# Patient Record
Sex: Male | Born: 1975 | Race: White | Hispanic: No | Smoking: Never smoker
Health system: Southern US, Community
[De-identification: ages and names within clinical notes are randomized; demographics above are authoritative.]

## PROBLEM LIST (undated history)

## (undated) DIAGNOSIS — F419 Anxiety disorder, unspecified: Secondary | ICD-10-CM

## (undated) DIAGNOSIS — K219 Gastro-esophageal reflux disease without esophagitis: Secondary | ICD-10-CM

## (undated) DIAGNOSIS — L723 Sebaceous cyst: Secondary | ICD-10-CM

## (undated) DIAGNOSIS — N453 Epididymo-orchitis: Secondary | ICD-10-CM

## (undated) DIAGNOSIS — J069 Acute upper respiratory infection, unspecified: Secondary | ICD-10-CM

## (undated) DIAGNOSIS — L0203 Carbuncle of face: Secondary | ICD-10-CM

## (undated) DIAGNOSIS — R0789 Other chest pain: Secondary | ICD-10-CM

## (undated) DIAGNOSIS — L0202 Furuncle of face: Secondary | ICD-10-CM

## (undated) DIAGNOSIS — G8929 Other chronic pain: Secondary | ICD-10-CM

## (undated) DIAGNOSIS — E538 Deficiency of other specified B group vitamins: Secondary | ICD-10-CM

## (undated) DIAGNOSIS — J329 Chronic sinusitis, unspecified: Secondary | ICD-10-CM

## (undated) DIAGNOSIS — E291 Testicular hypofunction: Secondary | ICD-10-CM

## (undated) DIAGNOSIS — E785 Hyperlipidemia, unspecified: Secondary | ICD-10-CM

## (undated) DIAGNOSIS — R0601 Orthopnea: Secondary | ICD-10-CM

## (undated) DIAGNOSIS — M549 Dorsalgia, unspecified: Secondary | ICD-10-CM

## (undated) HISTORY — DX: Testicular hypofunction: E29.1

## (undated) HISTORY — DX: Anxiety disorder, unspecified: F41.9

## (undated) HISTORY — DX: Deficiency of other specified B group vitamins: E53.8

## (undated) HISTORY — DX: Furuncle of face: L02.02

## (undated) HISTORY — DX: Carbuncle of face: L02.03

## (undated) HISTORY — DX: Other chronic pain: G89.29

## (undated) HISTORY — DX: Acute upper respiratory infection, unspecified: J06.9

## (undated) HISTORY — DX: Other chest pain: R07.89

## (undated) HISTORY — DX: Hyperlipidemia, unspecified: E78.5

## (undated) HISTORY — DX: Sebaceous cyst: L72.3

## (undated) HISTORY — DX: Orthopnea: R06.01

## (undated) HISTORY — DX: Chronic sinusitis, unspecified: J32.9

## (undated) HISTORY — PX: TONSILLECTOMY: SUR1361

## (undated) HISTORY — DX: Epididymo-orchitis: N45.3

## (undated) HISTORY — DX: Dorsalgia, unspecified: M54.9

## (undated) HISTORY — DX: Gastro-esophageal reflux disease without esophagitis: K21.9

---

## 1997-11-25 ENCOUNTER — Emergency Department (HOSPITAL_COMMUNITY): Admission: EM | Admit: 1997-11-25 | Discharge: 1997-11-25 | Payer: Self-pay | Admitting: Internal Medicine

## 1997-11-25 ENCOUNTER — Encounter: Payer: Self-pay | Admitting: Internal Medicine

## 1999-01-08 ENCOUNTER — Encounter: Payer: Self-pay | Admitting: Family Medicine

## 1999-01-08 ENCOUNTER — Encounter: Admission: RE | Admit: 1999-01-08 | Discharge: 1999-01-08 | Payer: Self-pay | Admitting: Family Medicine

## 1999-11-04 ENCOUNTER — Ambulatory Visit (HOSPITAL_COMMUNITY): Admission: RE | Admit: 1999-11-04 | Discharge: 1999-11-04 | Payer: Self-pay | Admitting: Orthopedic Surgery

## 1999-11-04 ENCOUNTER — Encounter: Payer: Self-pay | Admitting: Orthopedic Surgery

## 1999-11-18 ENCOUNTER — Ambulatory Visit (HOSPITAL_COMMUNITY): Admission: RE | Admit: 1999-11-18 | Discharge: 1999-11-18 | Payer: Self-pay | Admitting: Orthopedic Surgery

## 1999-11-18 ENCOUNTER — Encounter: Payer: Self-pay | Admitting: Orthopedic Surgery

## 1999-12-02 ENCOUNTER — Encounter: Payer: Self-pay | Admitting: Orthopedic Surgery

## 1999-12-02 ENCOUNTER — Ambulatory Visit (HOSPITAL_COMMUNITY): Admission: RE | Admit: 1999-12-02 | Discharge: 1999-12-02 | Payer: Self-pay | Admitting: Orthopedic Surgery

## 2000-11-16 ENCOUNTER — Emergency Department (HOSPITAL_COMMUNITY): Admission: EM | Admit: 2000-11-16 | Discharge: 2000-11-16 | Payer: Self-pay | Admitting: Emergency Medicine

## 2001-11-16 ENCOUNTER — Emergency Department (HOSPITAL_COMMUNITY): Admission: EM | Admit: 2001-11-16 | Discharge: 2001-11-16 | Payer: Self-pay | Admitting: Emergency Medicine

## 2001-11-16 ENCOUNTER — Encounter: Payer: Self-pay | Admitting: Emergency Medicine

## 2003-12-23 ENCOUNTER — Ambulatory Visit: Payer: Self-pay | Admitting: Family Medicine

## 2004-03-24 ENCOUNTER — Ambulatory Visit: Payer: Self-pay | Admitting: Family Medicine

## 2004-05-24 ENCOUNTER — Ambulatory Visit (HOSPITAL_BASED_OUTPATIENT_CLINIC_OR_DEPARTMENT_OTHER): Admission: RE | Admit: 2004-05-24 | Discharge: 2004-05-24 | Payer: Self-pay | Admitting: Orthopedic Surgery

## 2004-06-02 ENCOUNTER — Ambulatory Visit: Payer: Self-pay | Admitting: Family Medicine

## 2004-09-16 ENCOUNTER — Ambulatory Visit: Payer: Self-pay | Admitting: Family Medicine

## 2005-01-12 ENCOUNTER — Ambulatory Visit: Payer: Self-pay | Admitting: Pain Medicine

## 2005-01-29 ENCOUNTER — Ambulatory Visit: Payer: Self-pay | Admitting: Family Medicine

## 2006-02-01 ENCOUNTER — Ambulatory Visit: Payer: Self-pay | Admitting: Pain Medicine

## 2006-03-22 ENCOUNTER — Ambulatory Visit: Payer: Self-pay | Admitting: Family Medicine

## 2006-05-11 ENCOUNTER — Ambulatory Visit: Payer: Self-pay | Admitting: Family Medicine

## 2006-05-24 ENCOUNTER — Ambulatory Visit: Payer: Self-pay | Admitting: Family Medicine

## 2006-09-18 ENCOUNTER — Telehealth: Payer: Self-pay | Admitting: Family Medicine

## 2006-10-06 ENCOUNTER — Telehealth: Payer: Self-pay | Admitting: Family Medicine

## 2006-11-03 ENCOUNTER — Telehealth: Payer: Self-pay | Admitting: Family Medicine

## 2006-11-14 ENCOUNTER — Telehealth (INDEPENDENT_AMBULATORY_CARE_PROVIDER_SITE_OTHER): Payer: Self-pay | Admitting: *Deleted

## 2007-03-07 ENCOUNTER — Ambulatory Visit: Payer: Self-pay | Admitting: Pain Medicine

## 2007-03-12 ENCOUNTER — Ambulatory Visit: Payer: Self-pay | Admitting: Family Medicine

## 2007-03-12 DIAGNOSIS — J069 Acute upper respiratory infection, unspecified: Secondary | ICD-10-CM | POA: Insufficient documentation

## 2007-03-12 HISTORY — DX: Acute upper respiratory infection, unspecified: J06.9

## 2007-06-11 ENCOUNTER — Telehealth: Payer: Self-pay | Admitting: Family Medicine

## 2007-06-11 ENCOUNTER — Telehealth: Payer: Self-pay | Admitting: *Deleted

## 2007-06-28 DIAGNOSIS — N453 Epididymo-orchitis: Secondary | ICD-10-CM | POA: Insufficient documentation

## 2007-06-28 HISTORY — DX: Epididymo-orchitis: N45.3

## 2007-07-02 ENCOUNTER — Ambulatory Visit: Payer: Self-pay | Admitting: Family Medicine

## 2007-07-02 DIAGNOSIS — J309 Allergic rhinitis, unspecified: Secondary | ICD-10-CM | POA: Insufficient documentation

## 2007-07-02 LAB — CONVERTED CEMR LAB
Blood in Urine, dipstick: NEGATIVE
Ketones, urine, test strip: NEGATIVE
Nitrite: NEGATIVE
Urobilinogen, UA: 0.2
WBC Urine, dipstick: NEGATIVE

## 2007-07-03 ENCOUNTER — Telehealth: Payer: Self-pay | Admitting: Family Medicine

## 2007-07-09 ENCOUNTER — Telehealth: Payer: Self-pay | Admitting: *Deleted

## 2007-08-13 ENCOUNTER — Telehealth: Payer: Self-pay | Admitting: Internal Medicine

## 2007-08-14 ENCOUNTER — Encounter: Payer: Self-pay | Admitting: *Deleted

## 2007-08-20 ENCOUNTER — Ambulatory Visit: Payer: Self-pay | Admitting: Internal Medicine

## 2007-08-20 DIAGNOSIS — M549 Dorsalgia, unspecified: Secondary | ICD-10-CM

## 2007-08-20 HISTORY — DX: Dorsalgia, unspecified: M54.9

## 2007-08-23 ENCOUNTER — Encounter: Payer: Self-pay | Admitting: Internal Medicine

## 2007-08-23 ENCOUNTER — Telehealth: Payer: Self-pay | Admitting: Internal Medicine

## 2007-10-09 ENCOUNTER — Telehealth: Payer: Self-pay | Admitting: Family Medicine

## 2007-10-22 ENCOUNTER — Ambulatory Visit: Payer: Self-pay | Admitting: Family Medicine

## 2007-10-22 DIAGNOSIS — L0202 Furuncle of face: Secondary | ICD-10-CM | POA: Insufficient documentation

## 2007-10-22 DIAGNOSIS — L0203 Carbuncle of face: Secondary | ICD-10-CM

## 2007-10-22 HISTORY — DX: Furuncle of face: L02.02

## 2007-11-05 ENCOUNTER — Ambulatory Visit: Payer: Self-pay | Admitting: Family Medicine

## 2007-11-05 DIAGNOSIS — L723 Sebaceous cyst: Secondary | ICD-10-CM

## 2007-11-05 HISTORY — DX: Sebaceous cyst: L72.3

## 2008-01-21 ENCOUNTER — Ambulatory Visit: Payer: Self-pay | Admitting: Family Medicine

## 2008-01-21 DIAGNOSIS — F988 Other specified behavioral and emotional disorders with onset usually occurring in childhood and adolescence: Secondary | ICD-10-CM | POA: Insufficient documentation

## 2008-02-04 ENCOUNTER — Ambulatory Visit: Payer: Self-pay | Admitting: Family Medicine

## 2008-03-14 ENCOUNTER — Telehealth: Payer: Self-pay | Admitting: Family Medicine

## 2008-04-11 ENCOUNTER — Telehealth: Payer: Self-pay | Admitting: Family Medicine

## 2008-04-11 ENCOUNTER — Ambulatory Visit: Payer: Self-pay | Admitting: Family Medicine

## 2008-05-15 ENCOUNTER — Telehealth: Payer: Self-pay | Admitting: Family Medicine

## 2008-05-30 ENCOUNTER — Telehealth: Payer: Self-pay | Admitting: *Deleted

## 2008-05-30 ENCOUNTER — Telehealth: Payer: Self-pay | Admitting: Family Medicine

## 2008-07-02 ENCOUNTER — Ambulatory Visit: Payer: Self-pay | Admitting: Family Medicine

## 2008-07-02 DIAGNOSIS — Z862 Personal history of diseases of the blood and blood-forming organs and certain disorders involving the immune mechanism: Secondary | ICD-10-CM | POA: Insufficient documentation

## 2008-07-02 LAB — CONVERTED CEMR LAB
Alkaline Phosphatase: 58 units/L (ref 39–117)
BUN: 12 mg/dL (ref 6–23)
Basophils Absolute: 0 10*3/uL (ref 0.0–0.1)
Bilirubin, Direct: 0.1 mg/dL (ref 0.0–0.3)
CO2: 30 meq/L (ref 19–32)
Chloride: 108 meq/L (ref 96–112)
Eosinophils Absolute: 0.1 10*3/uL (ref 0.0–0.7)
Eosinophils Relative: 1.1 % (ref 0.0–5.0)
Glucose, Bld: 116 mg/dL — ABNORMAL HIGH (ref 70–99)
HCT: 46.6 % (ref 39.0–52.0)
Lymphs Abs: 1.4 10*3/uL (ref 0.7–4.0)
MCHC: 34.5 g/dL (ref 30.0–36.0)
MCV: 85.7 fL (ref 78.0–100.0)
Monocytes Absolute: 0.4 10*3/uL (ref 0.1–1.0)
Platelets: 216 10*3/uL (ref 150.0–400.0)
Potassium: 3.5 meq/L (ref 3.5–5.1)
RDW: 11.7 % (ref 11.5–14.6)
Saturation Ratios: 47.9 % (ref 20.0–50.0)
TSH: 0.82 microintl units/mL (ref 0.35–5.50)
Total Bilirubin: 0.5 mg/dL (ref 0.3–1.2)
Transferrin: 245.8 mg/dL (ref 212.0–360.0)

## 2008-07-03 ENCOUNTER — Encounter: Payer: Self-pay | Admitting: Family Medicine

## 2008-07-10 ENCOUNTER — Telehealth: Payer: Self-pay | Admitting: Family Medicine

## 2008-09-12 ENCOUNTER — Telehealth: Payer: Self-pay | Admitting: *Deleted

## 2008-09-13 ENCOUNTER — Telehealth: Payer: Self-pay | Admitting: Family Medicine

## 2008-10-16 ENCOUNTER — Telehealth: Payer: Self-pay | Admitting: Family Medicine

## 2008-10-27 ENCOUNTER — Telehealth: Payer: Self-pay | Admitting: Family Medicine

## 2008-10-30 ENCOUNTER — Ambulatory Visit: Payer: Self-pay | Admitting: Family Medicine

## 2008-12-22 ENCOUNTER — Telehealth: Payer: Self-pay | Admitting: Family Medicine

## 2009-02-05 ENCOUNTER — Ambulatory Visit: Payer: Self-pay | Admitting: Family Medicine

## 2009-02-23 ENCOUNTER — Telehealth: Payer: Self-pay | Admitting: Family Medicine

## 2009-02-24 ENCOUNTER — Telehealth: Payer: Self-pay | Admitting: Family Medicine

## 2009-02-25 ENCOUNTER — Telehealth: Payer: Self-pay | Admitting: Speech Pathology

## 2009-04-06 ENCOUNTER — Telehealth: Payer: Self-pay | Admitting: *Deleted

## 2009-07-03 ENCOUNTER — Telehealth: Payer: Self-pay | Admitting: *Deleted

## 2009-07-29 ENCOUNTER — Telehealth: Payer: Self-pay | Admitting: Family Medicine

## 2009-10-21 ENCOUNTER — Ambulatory Visit: Payer: Self-pay | Admitting: Pain Medicine

## 2009-11-16 ENCOUNTER — Telehealth: Payer: Self-pay | Admitting: Family Medicine

## 2009-12-21 ENCOUNTER — Telehealth: Payer: Self-pay | Admitting: Family Medicine

## 2010-02-14 ENCOUNTER — Encounter: Payer: Self-pay | Admitting: Orthopaedic Surgery

## 2010-02-23 NOTE — Progress Notes (Signed)
Summary: valium refill  Phone Note From Pharmacy   Summary of Call: patient is requesting a refill of valium. is this okay to fill? Initial call taken by: Kern Reap CMA (AAMA),  February 24, 2009 1:50 PM  Follow-up for Phone Call        Valium 2 mg, dispense 60 tablets, directions one nightly p.r.n. refills x 2 Follow-up by: Roderick Pee MD,  February 24, 2009 1:54 PM    New/Updated Medications: VALIUM 2 MG TABS (DIAZEPAM) take one tab at bedtime Prescriptions: VALIUM 2 MG TABS (DIAZEPAM) take one tab at bedtime  #60 x 2   Entered by:   Kern Reap CMA (AAMA)   Authorized by:   Roderick Pee MD   Signed by:   Kern Reap CMA (AAMA) on 02/24/2009   Method used:   Telephoned to ...       [facility] [address] [facility] (retail)       [address]       [place], [place]  [address]       Ph: [phone]       Fax: [phone]   RxID:   [id number]

## 2010-02-23 NOTE — Progress Notes (Signed)
Summary: requests increase ultram to 4 daily if need  Phone Note Call from Patient Call back at Work Phone [phone]   Caller: vm Call For: Cosandra Plouffe Reason for Call: Talk to Nurse Summary of Call: Re Ultram for low back & kneee pain.  Requests increasing to 3 or 4 day.  The 2 per day just not enough in the cold weather, he thinks must be causing the extra pain from his old injury.  Wants Rx for 4 daily if needed.   Other things do not work.  [facility].  [phone].   Initial call taken by: Rudy Jew, RN,  February 23, 2009 12:46 PM  Follow-up for Phone Call        if the above medication is not controlling his pain then we need a consult from orthopedics.  I would recommend Dr. Norlene Campbell is he already does not have a personal orthopedist Follow-up by: Roderick Pee MD,  February 23, 2009 1:36 PM  Additional Follow-up for Phone Call Additional follow up Details #1::        He will think it over & call back if he wants [place] to send order. Additional Follow-up by: Rudy Jew, RN,  February 23, 2009 2:02 PM     Appended Document: requests increase ultram to 4 daily if need Wants call backat [phone].  Appended Document: requests increase ultram to 4 daily if need No answer.  Appended Document: requests increase ultram to 4 daily if need No answer.  Appended Document: requests increase ultram to 4 daily if need Has been through ortho & pain management before & doesn't want to do again.  Requests that Dr. Tawanna Cooler call him.

## 2010-02-23 NOTE — Progress Notes (Signed)
Summary: adderall refill  Phone Note Refill Request   Refills Requested: Medication #1:  ADDERALL XR 20 MG XR24H-CAP 2 by mouth qam  Medication #2:  ADDERALL 20 MG TABS 1 @ 3 pm daily Initial call taken by: Kern Reap CMA Duncan Dull),  November 16, 2009 8:33 AM  Follow-up for Phone Call        patient only wants one month rx until he finds out if the pharamcy carries this rx or what his insurance will cover. Follow-up by: Kern Reap CMA Duncan Dull),  November 16, 2009 8:34 AM    Prescriptions: ADDERALL 20 MG TABS (AMPHETAMINE-DEXTROAMPHETAMINE) 1 @ 3 pm daily  #30 x 0   Entered by:   Kern Reap CMA (AAMA)   Authorized by:   Roderick Pee MD   Signed by:   Kern Reap CMA (AAMA) on 11/16/2009   Method used:   Print then Give to Patient   RxID:   [id number] ADDERALL XR 20 MG XR24H-CAP (AMPHETAMINE-DEXTROAMPHETAMINE) 2 by mouth qam  #60 x 0   Entered by:   Kern Reap CMA (AAMA)   Authorized by:   Roderick Pee MD   Signed by:   Kern Reap CMA (AAMA) on 11/16/2009   Method used:   Print then Give to Patient   RxID:   [id number]

## 2010-02-23 NOTE — Progress Notes (Signed)
Summary: referral pain management  Phone Note Call from Patient Call back at Work Phone [phone]   Call For: Roderick Pee MD Summary of Call: Will go back to Pain Management , [facility], Dr. Laban Emperor, [phone] or as he's in a lot of pain low back, knee, old injury.  He called them to get appointment, but they have to have referral.  Please do the referral & then he'll call & get appointment.  Please call him back at [phone]. Initial call taken by: Rudy Jew, RN,  February 25, 2009 12:33 PM  Follow-up for Phone Call        ok to refer  asap !!!!!!!!!!!!! Follow-up by: Roderick Pee MD,  February 26, 2009 1:48 PM

## 2010-02-23 NOTE — Progress Notes (Signed)
Summary: rx refill  Phone Note Refill Request Message from:  Patient on April 06, 2009 10:03 AM  Refills Requested: Medication #1:  ADDERALL XR 20 MG XR24H-CAP 2 by mouth qam  Medication #2:  ADDERALL 20 MG TABS 1 @ 3 pm daily Initial call taken by: Kern Reap CMA Duncan Dull),  April 06, 2009 10:03 AM    Prescriptions: ADDERALL 20 MG TABS (AMPHETAMINE-DEXTROAMPHETAMINE) take one tab at 3 pm   fill in two months  #30 x 0   Entered by:   Kern Reap CMA (AAMA)   Authorized by:   Roderick Pee MD   Signed by:   Kern Reap CMA (AAMA) on 04/06/2009   Method used:   Print then Give to Patient   RxID:   [id number] ADDERALL 20 MG TABS (AMPHETAMINE-DEXTROAMPHETAMINE) take one tab at 3 pm  fill in one month  #30 x 0   Entered by:   Kern Reap CMA (AAMA)   Authorized by:   Roderick Pee MD   Signed by:   Kern Reap CMA (AAMA) on 04/06/2009   Method used:   Print then Give to Patient   RxID:   [id number] ADDERALL 20 MG TABS (AMPHETAMINE-DEXTROAMPHETAMINE) 1 @ 3 pm daily  #30 x 0   Entered by:   Kern Reap CMA (AAMA)   Authorized by:   Roderick Pee MD   Signed by:   Kern Reap CMA (AAMA) on 04/06/2009   Method used:   Print then Give to Patient   RxID:   [id number] ADDERALL XR 20 MG XR24H-CAP (AMPHETAMINE-DEXTROAMPHETAMINE) take two tabs in the morning  fill in two months  #60 x 0   Entered by:   Kern Reap CMA (AAMA)   Authorized by:   Roderick Pee MD   Signed by:   Kern Reap CMA (AAMA) on 04/06/2009   Method used:   Print then Give to Patient   RxID:   [id number] ADDERALL XR 20 MG XR24H-CAP (AMPHETAMINE-DEXTROAMPHETAMINE) take two tabs in the morning fill in one month  #60 x 0   Entered by:   Kern Reap CMA (AAMA)   Authorized by:   Roderick Pee MD   Signed by:   Kern Reap CMA (AAMA) on 04/06/2009   Method used:   Print then Give to Patient   RxID:   [id number] ADDERALL XR 20 MG XR24H-CAP  (AMPHETAMINE-DEXTROAMPHETAMINE) 2 by mouth qam  #60 x 0   Entered by:   Kern Reap CMA (AAMA)   Authorized by:   Roderick Pee MD   Signed by:   Kern Reap CMA (AAMA) on 04/06/2009   Method used:   Print then Give to Patient   RxID:   [id number]

## 2010-02-23 NOTE — Progress Notes (Signed)
Summary: tramadol refill - fyi  Phone Note From Pharmacy   Summary of Call: patient is requesting a refill of tramadol.  he has been referred to pain management. is this okay to fill? Initial call taken by: Kern Reap CMA Duncan Dull),  July 29, 2009 11:10 AM  Follow-up for Phone Call        I thought he was already going to pain management????????????? if he hasn't been.......Marland Kitchen refill his  tramadol 50 mg p.o. t.i.d. p.r.n. dispense 60 tabs, refills x 1, if, however, he is going to the pain clinic, then they need to write all his pain medicine Follow-up by: Roderick Pee MD,  July 29, 2009 11:27 AM  Additional Follow-up for Phone Call Additional follow up Details #1::        patient has decided not to go to the pain clinic.  He is going to try and control his pain with tramadol. Additional Follow-up by: Kern Reap CMA Duncan Dull),  July 29, 2009 12:59 PM    Additional Follow-up for Phone Call Additional follow up Details #2::    please call patient ........Marland Kitchen recommend he go to the pain clinic for a complete evaluation and not take tramadol on a long-term basis.. have him call and reschedule or reschedule for him Follow-up by: Roderick Pee MD,  July 30, 2009 8:32 AM  Additional Follow-up for Phone Call Additional follow up Details #3:: Details for Additional Follow-up Action Taken: spoke with patient's wife Additional Follow-up by: Kern Reap CMA Duncan Dull),  July 30, 2009 2:15 PM

## 2010-02-23 NOTE — Progress Notes (Signed)
Summary: Rx Refill  Phone Note Refill Request Call back at Home Phone [phone] Call back at Work Phone [phone] Message from:  Patient on December 21, 2009 9:28 AM  Refills Requested: Medication #1:  ADDERALL 20 MG TABS 1 @ 3 pm daily  Medication #2:  ADDERALL XR 20 MG XR24H-CAP 2 by mouth qam Pt wants to know if med can be switced since it is hard getting adderall filled due to shortage.  Please call when rx is available for pick up.   Method Requested: Pick up at Office Initial call taken by: Trixie Dredge,  December 21, 2009 9:28 AM Caller: Patient Initial call taken by: Trixie Dredge,  December 21, 2009 9:28 AM  Follow-up for Phone Call        Pt called again. Follow-up by: Warnell Forester,  December 21, 2009 1:06 PM    Prescriptions: ADDERALL 20 MG TABS (AMPHETAMINE-DEXTROAMPHETAMINE) take one tab at 3 pm   fill in two months  #30 x 0   Entered by:   Kern Reap CMA (AAMA)   Authorized by:   Roderick Pee MD   Signed by:   Kern Reap CMA (AAMA) on 12/21/2009   Method used:   Print then Give to Patient   RxID:   [id number] ADDERALL 20 MG TABS (AMPHETAMINE-DEXTROAMPHETAMINE) take one tab at 3 pm  fill in one month  #30 x 0   Entered by:   Kern Reap CMA (AAMA)   Authorized by:   Roderick Pee MD   Signed by:   Kern Reap CMA (AAMA) on 12/21/2009   Method used:   Print then Give to Patient   RxID:   [id number] ADDERALL XR 20 MG XR24H-CAP (AMPHETAMINE-DEXTROAMPHETAMINE) take two tabs in the morning  fill in two months  #60 x 0   Entered by:   Kern Reap CMA (AAMA)   Authorized by:   Roderick Pee MD   Signed by:   Kern Reap CMA (AAMA) on 12/21/2009   Method used:   Print then Give to Patient   RxID:   [id number] ADDERALL XR 20 MG XR24H-CAP (AMPHETAMINE-DEXTROAMPHETAMINE) take two tabs in the morning fill in one month  #60 x 0   Entered by:   Kern Reap CMA (AAMA)   Authorized by:   Roderick Pee  MD   Signed by:   Kern Reap CMA (AAMA) on 12/21/2009   Method used:   Print then Give to Patient   RxID:   [id number] ADDERALL 20 MG TABS (AMPHETAMINE-DEXTROAMPHETAMINE) 1 @ 3 pm daily  #30 x 0   Entered by:   Kern Reap CMA (AAMA)   Authorized by:   Roderick Pee MD   Signed by:   Kern Reap CMA (AAMA) on 12/21/2009   Method used:   Print then Give to Patient   RxID:   [id number] ADDERALL XR 20 MG XR24H-CAP (AMPHETAMINE-DEXTROAMPHETAMINE) 2 by mouth qam  #60 x 0   Entered by:   Kern Reap CMA (AAMA)   Authorized by:   Roderick Pee MD   Signed by:   Kern Reap CMA (AAMA) on 12/21/2009   Method used:   Print then Give to Patient   RxID:   [id number]

## 2010-02-23 NOTE — Assessment & Plan Note (Signed)
Summary: R WRIST PAIN // RS   Vital Signs:  Patient profile:   35 year old male Height:      68 inches Weight:      184 pounds BMI:     28.08 Temp:     98.9 degrees F oral BP sitting:   110 / 70  (left arm)  Vitals Entered By: Doristine Devoid (February 05, 2009 12:15 PM) CC: R wrist pain and knot under L chin   CC:  R wrist pain and knot under L chin.  History of Present Illness: Nida Boatman is a 35 year old male, who comes in today for evaluation of 4 problems.  His pain in both wrists worse on the right than left.  He does a lot of martial arts.  No history of any fractures.  He had an infected cyst under the angle of his left jaw.  He expressed some pus material, but not gone.  His allergy problems, has now gotten worse.  These yusted  to be mild and seasonal and   All year round.  The only new environmental thing is that his dog is sleeping in their bedroom at night.  Now.  No history of asthma.  He would like a patch for motion sickness.  Also for   His back and his hands he would prefer some ultram  instead of the Motrin  Allergies: 1)  ! Penicillin PMH-FH-SH reviewed for relevance  Family History: Reviewed history from 07/02/2008 and no changes required. did has diabetes, reflux, esophagitis, and a borderline personality mom has  MS and mood swingsone brother in good health.  No problems  mother has hemachromatosis  Social History: Reviewed history from 03/12/2007 and no changes required. Occupation: gunsmith Married Never Smoked Alcohol use-no Drug use-no Regular exercise-yes  Review of Systems      See HPI  Physical Exam  General:  Well-developed,well-nourished,in no acute distress; alert,appropriate and cooperative throughout examination Head:  Normocephalic and atraumatic without obvious abnormalities. No apparent alopecia or balding. Eyes:  No corneal or conjunctival inflammation noted. EOMI. Perrla. Funduscopic exam benign, without hemorrhages, exudates  or papilledema. Vision grossly normal. Ears:  External ear exam shows no significant lesions or deformities.  Otoscopic examination reveals clear canals, tympanic membranes are intact bilaterally without bulging, retraction, inflammation or discharge. Hearing is grossly normal bilaterally. Nose:  External nasal examination shows no deformity or inflammation. Nasal mucosa are pink and moist without lesions or exudates. Mouth:  Oral mucosa and oropharynx without lesions or exudates.  Teeth in good repair. Neck:  No deformities, masses, or tenderness noted. Msk:  No deformity or scoliosis noted of thoracic or lumbar spine.   Skin:  there is a BB-sized lesion under the angle of the left jaw.  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 2 by mouth qam 2)  Adderall 20 Mg Tabs (Amphetamine-dextroamphetamine) .Marland Kitchen.. 1 @ 3 pm daily 3)  Adderall Xr 20 Mg Xr24h-cap (Amphetamine-dextroamphetamine) .... Take two tabs in the morning fill in one month 4)  Adderall Xr 20 Mg Xr24h-cap (Amphetamine-dextroamphetamine) .... Take two tabs in the morning  fill in two months 5)  Adderall 20 Mg Tabs (Amphetamine-dextroamphetamine) .... Take one tab at 3 pm  fill in one month 6)  Adderall 20 Mg Tabs (Amphetamine-dextroamphetamine) .... Take one tab at 3 pm   fill in two months 7)  Vicodin Es 7.5-750 Mg Tabs (Hydrocodone-acetaminophen) .Marland Kitchen.. 1 tab @ bedtime as needed back pain 8)  Hydromet 5-1.5 Mg/30ml Syrp (Hydrocodone-homatropine) .Marland Kitchen.. 1 or 2 tsps at bedtime as needed 9)  Biaxin 500 Mg Tabs (Clarithromycin) .Marland Kitchen.. 1 tab @ bedtime x 1o days 10)  Ultram 50 Mg Tabs (Tramadol hcl) .... Take 1 tablet by mouth two times a  day as needed pain 11)  Transderm-scop 1.5 Mg Pt72 (Scopolamine base) .... Uad  Patient Instructions: 1)  for the lesion under your last child take Biaxin 500 mg one tab nightly x 10 days. 2)  Used the Transderm-Scop, every 3 days for motion sickness. 3)  u  may take Ultram one or two tabs daily for wrist and back pain. 4)  Take Claritin or Zyrtec daily.  No D. and consider removing the dog from y  bedroom if you would like to pursue this further,   Call Dr. Reather Converse allergist Prescriptions: BIAXIN 500 MG TABS (CLARITHROMYCIN) 1 tab @ bedtime x 1o days  #10 x 1   Entered and Authorized by:   Roderick Pee MD   Signed by:   Roderick Pee MD on 02/05/2009   Method used:   Print then Give to Patient   RxID:   [id number] TRANSDERM-SCOP 1.5 MG PT72 (SCOPOLAMINE BASE) UAD  #4 x 0   Entered and Authorized by:   Roderick Pee MD   Signed by:   Roderick Pee MD on 02/05/2009   Method used:   Print then Give to Patient   RxID:   [id number] ULTRAM 50 MG TABS (TRAMADOL HCL) Take 1 tablet by mouth two times a day as needed pain  #60 x 5   Entered and Authorized by:   Roderick Pee MD   Signed by:   Roderick Pee MD on 02/05/2009   Method used:   Print then Give to Patient   RxID:   [id number] BIAXIN 500 MG TABS (CLARITHROMYCIN) 1 tab @ bedtime x 1o days  #10 x 1   Entered and Authorized by:   Roderick Pee MD   Signed by:   Roderick Pee MD on 02/05/2009   Method used:   Electronically to        [facility]  [address] (retail)       [address]       [place], [place]  [address]       Ph: [phone]       Fax: [phone]   RxID:   [id number]

## 2010-02-23 NOTE — Assessment & Plan Note (Signed)
Summary: severe leg pain/njr   Vital Signs:  Patient profile:   35 year old male Height:      68 inches Weight:      175 pounds BMI:     26.70 Temp:     98.9 degrees F oral BP sitting:   120 / 84  (left arm) Cuff size:   regular  Vitals Entered By: Kern Reap CMA (April 11, 2008 10:42 AM)  Reason for Visit left knee pain Pain Assessment Patient in pain? yes     Location: knee Intensity: 7 Type: sharp Onset of pain  Intermittent   History of Present Illness: Steven Murray is a 35 year old, married male, who comes in today for evaluation of pain in his left hip for 6 weeks.  He states 6 weeks ago.  He was jumping across to create the nurse gave way and all the pressure went to his left hip.  Since that time.  He had severe pain in the hip.  He describes it as constant, dull, sometimes sharp, on a scale of one to 10 and 8.  No previous history of trauma.  The pain radiates from his hip all of her down to his foot.  Allergies: 1)  ! Penicillin  Past History:  Past medical history reviewed for relevance to current acute and chronic problems. Social history (including risk factors) reviewed for relevance to current acute and chronic problems.  Past Medical History:    Reviewed history from 01/21/2008 and no changes required:    Allergic rhinitis    ADD  Social History:    Reviewed history from 03/12/2007 and no changes required:       Occupation: gunsmith       Married       Never Smoked       Alcohol use-no       Drug use-no       Regular exercise-yes  Review of Systems      See HPI  Physical Exam  General:  Well-developed,well-nourished,in no acute distress; alert,appropriate and cooperative throughout examination Msk:  examination of both lower extremities appear normal.  There is 0.25-inch leg length discrepancy, left leg, not significant.  Sensation muscle strength.  Reflexes are all within normal limits.  There is pain with external rotation of the left hip.   Right hip normal Pulses:  R and L carotid,radial,femoral,dorsalis pedis and posterior tibial pulses are full and equal bilaterally Extremities:  No clubbing, cyanosis, edema, or deformity noted with normal full range of motion of all joints.   Neurologic:  No cranial nerve deficits noted. Station and gait are normal. Plantar reflexes are down-going bilaterally. DTRs are symmetrical throughout. Sensory, motor and coordinative functions appear intact.   Impression & Recommendations:  Problem # 1:  HIP PAIN, LEFT (ICD-719.45) Assessment New  Orders: Prescription Created Electronically [phone]) T-Bilateral Hip w/Pelvis, min 2 views (73520TC)  His updated medication list for this problem includes:    Vicodin 5-500 Mg Tabs (Hydrocodone-acetaminophen) .Marland Kitchen... Take 1 tablet by mouth four times a day as needed pain  Complete Medication List: 1)  Adderall Xr 20 Mg Xr24h-cap (Amphetamine-dextroamphetamine) .... 2 by mouth qam 2)  Adderall 20 Mg Tabs (Amphetamine-dextroamphetamine) .Marland Kitchen.. 1 @ 3 pm daily 3)  Doxycycline Hyclate 100 Mg Tabs (Doxycycline hyclate) .... One by mouth bid 4)  Vicodin 5-500 Mg Tabs (Hydrocodone-acetaminophen) .... Take 1 tablet by mouth four times a day as needed pain  Patient Instructions: 1)  take 600 mg of Motrin 3 times a  day with food.  Also Vicodin, one tablet every 4 to 6 hours as needed for severe pain.  I will call you I get your radiology report and will go from there Prescriptions: VICODIN 5-500 MG TABS (HYDROCODONE-ACETAMINOPHEN) Take 1 tablet by mouth four times a day as needed pain  #50 x 2   Entered and Authorized by:   Roderick Pee MD   Signed by:   Roderick Pee MD on 04/11/2008   Method used:   Print then Mail to Patient   RxID:   [id number]

## 2010-02-23 NOTE — Progress Notes (Signed)
Summary: Refill Both RX for Adderall  Phone Note Call from Patient   Caller: Spouse Call For: Roderick Pee MD Summary of Call: Refill Request for: Medication #1:  ADDERALL XR 20 MG XR24H-CAP 2 by mouth qam  and Medication #2:  ADDERALL 20 MG TABS 1 @ 3 pm daily  Call Wife back before end of day totally out of meds:: [phone] Initial call taken by: Kathrynn Speed CMA,  July 03, 2009 9:36 AM  Follow-up for Phone Call        is it time ????????? Follow-up by: Roderick Pee MD,  July 03, 2009 9:42 AM    Prescriptions: ADDERALL 20 MG TABS (AMPHETAMINE-DEXTROAMPHETAMINE) take one tab at 3 pm   fill in two months  #30 x 0   Entered by:   Kathrynn Speed CMA   Authorized by:   Roderick Pee MD   Signed by:   Kathrynn Speed CMA on 07/03/2009   Method used:   Print then Give to Patient   RxID:   [id number] ADDERALL 20 MG TABS (AMPHETAMINE-DEXTROAMPHETAMINE) take one tab at 3 pm  fill in one month  #30 x 0   Entered by:   Kathrynn Speed CMA   Authorized by:   Roderick Pee MD   Signed by:   Kathrynn Speed CMA on 07/03/2009   Method used:   Print then Give to Patient   RxID:   [id number] ADDERALL 20 MG TABS (AMPHETAMINE-DEXTROAMPHETAMINE) 1 @ 3 pm daily  #30 x 0   Entered by:   Kathrynn Speed CMA   Authorized by:   Roderick Pee MD   Signed by:   Kathrynn Speed CMA on 07/03/2009   Method used:   Print then Give to Patient   RxID:   [id number] ADDERALL XR 20 MG XR24H-CAP (AMPHETAMINE-DEXTROAMPHETAMINE) take two tabs in the morning  fill in two months  #60 x 0   Entered by:   Kathrynn Speed CMA   Authorized by:   Roderick Pee MD   Signed by:   Kathrynn Speed CMA on 07/03/2009   Method used:   Print then Give to Patient   RxID:   [id number] ADDERALL XR 20 MG XR24H-CAP (AMPHETAMINE-DEXTROAMPHETAMINE) take two tabs in the morning fill in one month  #60 x 0   Entered by:   Kathrynn Speed CMA   Authorized by:   Roderick Pee MD   Signed by:   Kathrynn Speed CMA on 07/03/2009   Method used:   Print then Give to Patient   RxID:   [id number] ADDERALL XR 20 MG XR24H-CAP (AMPHETAMINE-DEXTROAMPHETAMINE) 2 by mouth qam  #60 x 0   Entered by:   Kathrynn Speed CMA   Authorized by:   Roderick Pee MD   Signed by:   Kathrynn Speed CMA on 07/03/2009   Method used:   Print then Give to Patient   RxID:   [id number]

## 2010-04-01 ENCOUNTER — Other Ambulatory Visit: Payer: Self-pay | Admitting: Family Medicine

## 2010-04-01 NOTE — Telephone Encounter (Signed)
Pt needs adderall xr 20mg  and adderall 20mg  3 month supply.

## 2010-04-03 NOTE — Telephone Encounter (Signed)
Ok x 3 mo. 

## 2010-04-05 MED ORDER — AMPHETAMINE-DEXTROAMPHETAMINE 20 MG PO TABS: ORAL_TABLET | ORAL | Status: DC

## 2010-04-05 MED ORDER — AMPHETAMINE-DEXTROAMPHET ER 20 MG PO CP24: ORAL_CAPSULE | ORAL | Status: DC

## 2010-04-06 NOTE — Telephone Encounter (Signed)
rx ready for pick up. Left message on machine for patient. 

## 2010-06-22 ENCOUNTER — Other Ambulatory Visit: Payer: Self-pay | Admitting: Family Medicine

## 2010-06-22 NOTE — Telephone Encounter (Signed)
Time for an office visit 

## 2010-07-29 ENCOUNTER — Telehealth: Payer: Self-pay | Admitting: *Deleted

## 2010-07-29 MED ORDER — AMPHETAMINE-DEXTROAMPHET ER 20 MG PO CP24: ORAL_CAPSULE | ORAL | Status: DC

## 2010-07-29 MED ORDER — AMPHETAMINE-DEXTROAMPHETAMINE 20 MG PO TABS: ORAL_TABLET | ORAL | Status: DC

## 2010-07-29 NOTE — Telephone Encounter (Signed)
Wife is calling for refill of adderall.  Last office visit was 1/11.  Is this okay to fill?

## 2010-07-29 NOTE — Telephone Encounter (Signed)
Okay to give 30 tabs one daily office visit and to within two weeks

## 2010-07-30 NOTE — Telephone Encounter (Signed)
Pts wife called and said that pts mother in law, Janyth Pupa, will be picking script up for pt.

## 2010-07-30 NOTE — Telephone Encounter (Signed)
rx ready for pick up and patient is aware  

## 2012-10-27 ENCOUNTER — Emergency Department (HOSPITAL_COMMUNITY)
Admission: EM | Admit: 2012-10-27 | Discharge: 2012-10-27 | Disposition: A | Discharge status: Home / Self Care | Payer: BC Managed Care – PPO | Attending: Emergency Medicine | Admitting: Emergency Medicine

## 2012-10-27 ENCOUNTER — Encounter (HOSPITAL_COMMUNITY): Payer: Self-pay | Admitting: Emergency Medicine

## 2012-10-27 DIAGNOSIS — Z79899 Other long term (current) drug therapy: Secondary | ICD-10-CM | POA: Insufficient documentation

## 2012-10-27 DIAGNOSIS — S0501XA Injury of conjunctiva and corneal abrasion without foreign body, right eye, initial encounter: Secondary | ICD-10-CM

## 2012-10-27 DIAGNOSIS — S058X9A Other injuries of unspecified eye and orbit, initial encounter: Secondary | ICD-10-CM | POA: Insufficient documentation

## 2012-10-27 DIAGNOSIS — H53149 Visual discomfort, unspecified: Secondary | ICD-10-CM | POA: Insufficient documentation

## 2012-10-27 DIAGNOSIS — Y9389 Activity, other specified: Secondary | ICD-10-CM | POA: Insufficient documentation

## 2012-10-27 DIAGNOSIS — IMO0002 Reserved for concepts with insufficient information to code with codable children: Secondary | ICD-10-CM | POA: Insufficient documentation

## 2012-10-27 DIAGNOSIS — Y929 Unspecified place or not applicable: Secondary | ICD-10-CM | POA: Insufficient documentation

## 2012-10-27 DIAGNOSIS — Z880 Allergy status to penicillin: Secondary | ICD-10-CM | POA: Insufficient documentation

## 2012-10-27 MED ORDER — OXYCODONE-ACETAMINOPHEN 5-325 MG PO TABS
2.0000 | ORAL_TABLET | Freq: Once | ORAL | Status: AC
  Administered 2012-10-27: 2 via ORAL
  Filled 2012-10-27: qty 2

## 2012-10-27 MED ORDER — OXYCODONE-ACETAMINOPHEN 5-325 MG PO TABS: 1.0000 | ORAL_TABLET | ORAL | Status: DC | PRN

## 2012-10-27 MED ORDER — OXYCODONE-ACETAMINOPHEN 5-325 MG PO TABS
1.0000 | ORAL_TABLET | Freq: Once | ORAL | Status: DC
  Filled 2012-10-27: qty 1

## 2012-10-27 MED ORDER — TETRACAINE HCL 0.5 % OP SOLN
1.0000 [drp] | Freq: Once | OPHTHALMIC | Status: AC
  Administered 2012-10-27: 1 [drp] via OPHTHALMIC
  Filled 2012-10-27: qty 2

## 2012-10-27 MED ORDER — ERYTHROMYCIN 5 MG/GM OP OINT
TOPICAL_OINTMENT | Freq: Once | OPHTHALMIC | Status: AC
  Administered 2012-10-27: 21:00:00 via OPHTHALMIC
  Filled 2012-10-27: qty 3.5

## 2012-10-27 NOTE — ED Notes (Signed)
Pt reports cutting tree limbs and a chunk from the tree limb broke off hitting the right eye. "I have had a scratched eye before and it hurts like that times 10"

## 2012-10-27 NOTE — ED Notes (Signed)
Pt reports he cleaned eye out with water. Denies any feeling of foreign body, scratching, itching.  C/o pain, watery eye, occasional blurry vision.

## 2012-10-31 NOTE — ED Provider Notes (Signed)
CSN: [id number]     Arrival date & time 10/27/12  2026 History   First MD Initiated Contact with Patient 10/27/12 2046     Chief Complaint  Patient presents with  . Eye Pain   (Consider location/radiation/quality/duration/timing/severity/associated sxs/prior Treatment) Patient is a 37 y.o. male presenting with eye pain. The history is provided by the patient.  Eye Pain This is a new problem. The current episode started today (He was cutting tree limbs when a chunk of wood flew and hit him in his right eye.). The problem occurs constantly. The problem has been unchanged. Pertinent negatives include no arthralgias, chest pain, congestion, fever, headaches, nausea or sore throat. Exacerbated by: blinking and sunlight makes his pain worse. Treatments tried: flushing. The treatment provided no relief.   He denies decreased visual acuity except for the increased tear production in this eye.  History reviewed. No pertinent past medical history. History reviewed. No pertinent past surgical history. No family history on file. History  Substance Use Topics  . Smoking status: Never Smoker   . Smokeless tobacco: Not on file  . Alcohol Use: No    Review of Systems  Constitutional: Negative for fever.  HENT: Negative for congestion, ear pain and sore throat.   Eyes: Positive for photophobia, pain and redness. Negative for itching and visual disturbance.  Respiratory: Negative for shortness of breath.   Cardiovascular: Negative for chest pain.  Gastrointestinal: Negative for nausea.  Musculoskeletal: Negative for arthralgias.  Skin: Negative.  Negative for wound.  Neurological: Negative for dizziness, light-headedness and headaches.    Allergies  Penicillins  Home Medications   Current Outpatient Rx  Name  Route  Sig  Dispense  Refill  . amphetamine-dextroamphetamine (ADDERALL XR, 20MG ,) 20 MG 24 hr capsule      Two tabs in the morning   60 capsule   0     Fill in one month   .  amphetamine-dextroamphetamine (ADDERALL XR, 20MG ,) 20 MG 24 hr capsule      2 tabs in the morning   60 capsule   0     Fill in two months   . amphetamine-dextroamphetamine (ADDERALL XR, 20MG ,) 20 MG 24 hr capsule      2 tabs in the morning   60 capsule   0   . amphetamine-dextroamphetamine (ADDERALL) 20 MG tablet      One tab at 3 pm   30 tablet   0   . amphetamine-dextroamphetamine (ADDERALL, 20MG ,) 20 MG tablet      One tab at 3 pm   30 tablet   0     Fill in one month   . amphetamine-dextroamphetamine (ADDERALL, 20MG ,) 20 MG tablet      One tab at 3 pm   30 tablet   0     Fill in two months   . oxyCODONE-acetaminophen (ROXICET) 5-325 MG per tablet   Oral   Take 1 tablet by mouth every 4 (four) hours as needed for pain.   30 tablet   0   . PROPECIA 1 MG tablet      TAKE ONE TABLET BY MOUTH EVERY DAY   30 each   0    BP 136/97  Pulse 92  Temp(Src) 98.3 F (36.8 C) (Oral)  Resp 20  Ht 5\' 8"  (1.727 m)  Wt 178 lb (80.74 kg)  BMI 27.07 kg/m2  SpO2 99% Physical Exam  Nursing note and vitals reviewed. Constitutional: He appears well-developed and well-nourished.  HENT:  Head: Normocephalic and atraumatic.  Right Ear: External ear normal.  Left Ear: External ear normal.  Eyes: EOM are normal. Pupils are equal, round, and reactive to light. Right eye exhibits no chemosis and no discharge. No foreign body present in the right eye. Right conjunctiva is injected. Right conjunctiva has no hemorrhage.  Slit lamp exam:      The right eye shows corneal abrasion and fluorescein uptake. The right eye shows no corneal flare, no corneal ulcer, no foreign body, no hyphema and no hypopyon.  Linear abrasion vertically on right medial cornea, smaller fainter abrasion medial to the first more prominent one.  Neck: Normal range of motion.  Cardiovascular: Normal rate, regular rhythm, normal heart sounds and intact distal pulses.   Pulmonary/Chest: Effort normal and  breath sounds normal. He has no wheezes.  Abdominal: Soft. Bowel sounds are normal. There is no tenderness.  Musculoskeletal: Normal range of motion.  Neurological: He is alert.  Skin: Skin is warm and dry.  Psychiatric: He has a normal mood and affect.    ED Course  Procedures (including critical care time) Labs Review Labs Reviewed - No data to display Imaging Review No results found.  MDM   1. Corneal abrasion, right, initial encounter    Visual acuity noted,  20/20 os/od/ou.  He was given erythromycin ointment, first dose applied .  Also prescribed oxycodone. Referral to Dr Lita Mains for a recheck in 2 days.  The patient appears reasonably screened and/or stabilized for discharge and I doubt any other medical condition or other [facility] requiring further screening, evaluation, or treatment in the ED at this time prior to discharge.     Burgess Amor, PA-C 10/31/12 2108

## 2012-11-02 NOTE — ED Provider Notes (Signed)
Medical screening examination/treatment/procedure(s) were performed by non-physician practitioner and as supervising physician I was immediately available for consultation/collaboration.  Forbes Loll, MD 11/02/12 2224 

## 2013-09-12 ENCOUNTER — Emergency Department (HOSPITAL_COMMUNITY)
Admission: EM | Admit: 2013-09-12 | Discharge: 2013-09-13 | Disposition: A | Discharge status: Home / Self Care | Payer: BC Managed Care – PPO | Attending: Emergency Medicine | Admitting: Emergency Medicine

## 2013-09-12 DIAGNOSIS — Z880 Allergy status to penicillin: Secondary | ICD-10-CM | POA: Diagnosis not present

## 2013-09-12 DIAGNOSIS — Z79899 Other long term (current) drug therapy: Secondary | ICD-10-CM | POA: Insufficient documentation

## 2013-09-12 DIAGNOSIS — R0602 Shortness of breath: Secondary | ICD-10-CM | POA: Diagnosis not present

## 2013-09-12 DIAGNOSIS — R0789 Other chest pain: Secondary | ICD-10-CM | POA: Diagnosis not present

## 2013-09-12 DIAGNOSIS — M546 Pain in thoracic spine: Secondary | ICD-10-CM | POA: Diagnosis present

## 2013-09-12 DIAGNOSIS — R000 Tachycardia, unspecified: Secondary | ICD-10-CM | POA: Insufficient documentation

## 2013-09-12 NOTE — ED Notes (Signed)
Pt c/o increased back pain x 2 days.

## 2013-09-12 NOTE — ED Notes (Signed)
Reports sharp stabbing pain to upper back radiating to cramping/pressure pain to chest.

## 2013-09-13 ENCOUNTER — Emergency Department (HOSPITAL_COMMUNITY): Payer: BC Managed Care – PPO

## 2013-09-13 LAB — D-DIMER, QUANTITATIVE (NOT AT ARMC): D-Dimer, Quant: <0.27 ug/mL-FEU (ref 0.00–0.48)

## 2013-09-13 MED ORDER — OXYCODONE-ACETAMINOPHEN 5-325 MG PO TABS
1.0000 | ORAL_TABLET | Freq: Once | ORAL | Status: AC
  Administered 2013-09-13: 1 via ORAL
  Filled 2013-09-13: qty 1

## 2013-09-13 MED ORDER — OXYCODONE-ACETAMINOPHEN 5-325 MG PO TABS: 1.0000 | ORAL_TABLET | ORAL | Status: DC | PRN

## 2013-09-13 NOTE — ED Notes (Signed)
nad noted prior to dc. Dc instructions reviewed and voiced understanding.,

## 2013-09-13 NOTE — Discharge Instructions (Signed)

## 2013-09-13 NOTE — ED Provider Notes (Signed)
Medical screening examination/treatment/procedure(s) were conducted as a shared visit with non-physician practitioner(s) and myself.  I personally evaluated the patient during the encounter.   EKG Interpretation   Date/Time:  Friday September 13 2013 01:35:58 EDT Ventricular Rate:  81 PR Interval:  148 QRS Duration: 88 QT Interval:  341 QTC Calculation: 396 R Axis:   71 Text Interpretation:  Sinus rhythm Borderline repolarization abnormality  inverted t wave noted in lead III No previous ECGs available Confirmed by  Bebe ShaggyWICKLINE  MD, Dorinda HillNALD ([id number]) on 09/13/2013 1:43:24 AM       Pt well appearing, no distress Low risk for PE and negative d-dimer I doubt ACS/Dissection He has no focal abd tenderness Stable for d/c home    Joya Gaskinsonald W Tresea Heine, MD 09/13/13 0225

## 2013-09-13 NOTE — ED Notes (Signed)
Pt requesting something for pain. Dr. Bebe ShaggyWickline aware.

## 2013-09-13 NOTE — ED Provider Notes (Signed)
CSN: [id number]     Arrival date & time 09/12/13  2305 History   First MD Initiated Contact with Patient 09/12/13 2335     Chief Complaint  Patient presents with  . Back Pain     (Consider location/radiation/quality/duration/timing/severity/associated sxs/prior Treatment) HPI  Steven Murray is a 38 y.o. male presenting with shortness of breath and right mid back pain.  He describes having vague, intermittent episodes of shortness of breath starting about 3-4 days ago,  Then 2 days as he was setting down out of his truck,  He developed right mid back pain which is now radiating into his right anterior chest.  His pain has been constant now for 2 days, waxing and waning and not worsened with palpation and movement, in fact, he notices it worsens at rest.  He denies fevers, chills, abdominal pain,  Vomiting, dysuria or hematuria, diaphoresis, cough, peripheral edema or extremity pain.  He has nausea when the pain becomes severe.  It is not triggered by meals.  He has taken tramadol and ibuprofen today without relief of pain.    No past medical history on file. No past surgical history on file. No family history on file. History  Substance Use Topics  . Smoking status: Never Smoker   . Smokeless tobacco: Not on file  . Alcohol Use: No    Review of Systems  Constitutional: Negative for fever, chills and diaphoresis.  HENT: Negative for congestion, sneezing and sore throat.   Eyes: Negative.   Respiratory: Positive for shortness of breath. Negative for cough and chest tightness.   Cardiovascular: Positive for chest pain. Negative for palpitations and leg swelling.  Gastrointestinal: Negative for nausea and abdominal pain.  Genitourinary: Negative.  Negative for dysuria and hematuria.  Musculoskeletal: Negative for arthralgias, joint swelling and neck pain.  Skin: Negative.  Negative for rash and wound.  Neurological: Negative for dizziness, weakness, light-headedness, numbness and  headaches.  Psychiatric/Behavioral: Negative.       Allergies  Penicillins  Home Medications   Prior to Admission medications   Medication Sig Start Date End Date Taking? Authorizing Provider  amphetamine-dextroamphetamine (ADDERALL XR, 20MG ,) 20 MG 24 hr capsule 2 tabs in the morning 04/05/10  Yes Roderick Pee, MD  cetirizine (ZYRTEC) 10 MG tablet Take 10 mg by mouth daily.   Yes Historical Provider, MD  oxyCODONE-acetaminophen (ROXICET) 5-325 MG per tablet Take 1 tablet by mouth every 4 (four) hours as needed for pain. 10/27/12  Yes Burgess Amor, PA-C  PROPECIA 1 MG tablet TAKE ONE TABLET BY MOUTH EVERY DAY 06/22/10  Yes Roderick Pee, MD  traMADol (ULTRAM) 50 MG tablet Take by mouth every 6 (six) hours as needed.   Yes Historical Provider, MD  amphetamine-dextroamphetamine (ADDERALL XR, 20MG ,) 20 MG 24 hr capsule Two tabs in the morning 04/05/10   Roderick Pee, MD  amphetamine-dextroamphetamine (ADDERALL XR, 20MG ,) 20 MG 24 hr capsule 2 tabs in the morning 07/29/10   Roderick Pee, MD  amphetamine-dextroamphetamine (ADDERALL) 20 MG tablet One tab at 3 pm 07/29/10   Roderick Pee, MD  amphetamine-dextroamphetamine (ADDERALL, 20MG ,) 20 MG tablet One tab at 3 pm 04/05/10   Roderick Pee, MD  amphetamine-dextroamphetamine (ADDERALL, 20MG ,) 20 MG tablet One tab at 3 pm 04/05/10   Roderick Pee, MD   BP 149/88  Pulse 103  Temp(Src) 98 F (36.7 C)  Resp 18  Ht 5' 8.5" (1.74 m)  Wt 178 lb (80.74 kg)  BMI 26.67  kg/m2  SpO2 98% Physical Exam  Nursing note and vitals reviewed. Constitutional: He appears well-developed and well-nourished.  HENT:  Head: Normocephalic and atraumatic.  Mouth/Throat: No oropharyngeal exudate.  Eyes: Conjunctivae are normal.  Neck: Normal range of motion.  Cardiovascular: Regular rhythm, normal heart sounds and intact distal pulses.   No murmur heard. Borderline tachycardic  Pulmonary/Chest: Effort normal and breath sounds normal. No respiratory distress.  He has no wheezes. He has no rales. He exhibits no tenderness.  Back pain is right mid back but not reproducible with palpation.  Abdominal: Soft. Bowel sounds are normal. There is no tenderness. There is no rigidity, no guarding and negative Murphy's sign.  Musculoskeletal: Normal range of motion.  Neurological: He is alert.  Skin: Skin is warm and dry.  Psychiatric: He has a normal mood and affect.    ED Course  Procedures (including critical care time) Labs Review Labs Reviewed - No data to display  Imaging Review Dg Chest 2 View  09/13/2013   CLINICAL DATA:  Stabbing chest pain.  EXAM: CHEST  2 VIEW  COMPARISON:  None.  FINDINGS: The heart size and mediastinal contours are within normal limits. Both lungs are clear. The visualized skeletal structures are unremarkable.  IMPRESSION: No active cardiopulmonary disease.   Electronically Signed   By: Awilda Metroourtnay  Bloomer   On: 09/13/2013 00:53     EKG Interpretation None      MDM   Final diagnoses:  None    CXR negative.  Pt seen by Dr. Bebe ShaggyWickline who will follow patient.  Pending d dimer and ekg.     Burgess AmorJulie Almadelia Looman, PA-C 09/13/13 [phone]

## 2014-05-07 ENCOUNTER — Encounter (INDEPENDENT_AMBULATORY_CARE_PROVIDER_SITE_OTHER): Payer: Self-pay | Admitting: *Deleted

## 2014-06-02 ENCOUNTER — Ambulatory Visit (INDEPENDENT_AMBULATORY_CARE_PROVIDER_SITE_OTHER): Payer: Self-pay | Admitting: Internal Medicine

## 2015-08-11 ENCOUNTER — Emergency Department (HOSPITAL_COMMUNITY): Payer: BLUE CROSS/BLUE SHIELD

## 2015-08-11 ENCOUNTER — Encounter (HOSPITAL_COMMUNITY): Payer: Self-pay

## 2015-08-11 ENCOUNTER — Emergency Department (HOSPITAL_COMMUNITY)
Admission: EM | Admit: 2015-08-11 | Discharge: 2015-08-11 | Disposition: A | Discharge status: Home / Self Care | Payer: BLUE CROSS/BLUE SHIELD | Attending: Emergency Medicine | Admitting: Emergency Medicine

## 2015-08-11 DIAGNOSIS — Z79899 Other long term (current) drug therapy: Secondary | ICD-10-CM | POA: Diagnosis not present

## 2015-08-11 DIAGNOSIS — R0602 Shortness of breath: Secondary | ICD-10-CM | POA: Insufficient documentation

## 2015-08-11 DIAGNOSIS — E0590 Thyrotoxicosis, unspecified without thyrotoxic crisis or storm: Secondary | ICD-10-CM | POA: Insufficient documentation

## 2015-08-11 DIAGNOSIS — R0789 Other chest pain: Secondary | ICD-10-CM | POA: Diagnosis present

## 2015-08-11 DIAGNOSIS — R002 Palpitations: Secondary | ICD-10-CM

## 2015-08-11 LAB — BASIC METABOLIC PANEL
Anion gap: 5 (ref 5–15)
BUN: 8 mg/dL (ref 6–20)
CHLORIDE: 102 mmol/L (ref 101–111)
CO2: 28 mmol/L (ref 22–32)
Calcium: 8.9 mg/dL (ref 8.9–10.3)
Creatinine, Ser: 0.84 mg/dL (ref 0.61–1.24)
GFR calc Af Amer: >60 mL/min (ref >60)
GLUCOSE: 94 mg/dL (ref 65–99)
POTASSIUM: 3.4 mmol/L — AB (ref 3.5–5.1)
Sodium: 135 mmol/L (ref 135–145)

## 2015-08-11 LAB — CBC
HEMATOCRIT: 49.1 % (ref 39.0–52.0)
Hemoglobin: 16.7 g/dL (ref 13.0–17.0)
MCH: 29.6 pg (ref 26.0–34.0)
MCHC: 34 g/dL (ref 30.0–36.0)
MCV: 86.9 fL (ref 78.0–100.0)
Platelets: 316 10*3/uL (ref 150–400)
RBC: 5.65 MIL/uL (ref 4.22–5.81)
RDW: 12.2 % (ref 11.5–15.5)
WBC: 10.6 10*3/uL — ABNORMAL HIGH (ref 4.0–10.5)

## 2015-08-11 LAB — TROPONIN I: Troponin I: <0.03 ng/mL (ref <0.03)

## 2015-08-11 LAB — TSH: TSH: 0.264 u[IU]/mL — ABNORMAL LOW (ref 0.350–4.500)

## 2015-08-11 LAB — D-DIMER, QUANTITATIVE (NOT AT ARMC): D DIMER QUANT: 0.39 ug{FEU}/mL (ref 0.00–0.50)

## 2015-08-11 MED ORDER — METOPROLOL SUCCINATE ER 25 MG PO TB24
ORAL_TABLET | ORAL | Status: AC
  Filled 2015-08-11: qty 1

## 2015-08-11 MED ORDER — METOPROLOL SUCCINATE ER 25 MG PO TB24
25.0000 mg | ORAL_TABLET | Freq: Every day | ORAL | Status: DC
  Administered 2015-08-11: 25 mg via ORAL

## 2015-08-11 MED ORDER — GI COCKTAIL ~~LOC~~
30.0000 mL | Freq: Once | ORAL | Status: AC
  Administered 2015-08-11: 30 mL via ORAL
  Filled 2015-08-11: qty 30

## 2015-08-11 NOTE — Discharge Instructions (Signed)

## 2015-08-11 NOTE — ED Notes (Signed)
MD at bedside. 

## 2015-08-11 NOTE — ED Provider Notes (Signed)
CSN: [id number]     Arrival date & time 08/11/15  1649 History   First MD Initiated Contact with Patient 08/11/15 1705     Chief Complaint  Patient presents with  . Chest Pain     (Consider location/radiation/quality/duration/timing/severity/associated sxs/prior Treatment) HPI  The patient is a 40 year old male, he has no prior past medical history, he has had some pressure and heaviness in his chest and shortness of breath which has been intermittent over the last 3 days. Nothing seems to make this better or worse especially exertion and in fact it does seem to come on at night more often. He reports that it lasted several hours the other night. At this time the patient is feeling very good and relaxed. He notes that when he takes a half of a Valium he feels much better and it gradually goes down within the hour. He denies swelling of his legs and denies any other risk factors for pulmonary embolism. He has no risk factors for acute coronary syndrome including family history or tobacco use. He denies any radiation of the discomfort in his chest or shoulders neck or jaw and has no back pain. There has been no recent travel, trauma, immobilization or fractures. He denies fevers, coughing and has no abdominal pain nausea diarrhea. He has never had any cardiac history and denies any family members with cardiac history. He does drink a significant amount of caffeine during the day stating that he drinks sweet tea all day long and takes 2 cups of coffee per day. He denies any other stimulant use. Of note the patient has been prescribed Adderall in the past. He does report that he has palpitations along with the symptoms.  History reviewed. No pertinent past medical history. History reviewed. No pertinent past surgical history. History reviewed. No pertinent family history. Social History  Substance Use Topics  . Smoking status: Never Smoker   . Smokeless tobacco: None  . Alcohol Use: No    Review  of Systems  All other systems reviewed and are negative.     Allergies  Penicillins  Home Medications   Prior to Admission medications   Medication Sig Start Date End Date Taking? Authorizing Provider  amphetamine-dextroamphetamine (ADDERALL XR, 20MG ,) 20 MG 24 hr capsule Two tabs in the morning Patient taking differently: Take 40 mg by mouth every morning.  04/05/10  Yes Roderick PeeJeffrey A Todd, MD  amphetamine-dextroamphetamine (ADDERALL, 20MG ,) 20 MG tablet One tab at 3 pm Patient taking differently: Take 20 mg by mouth daily. One tab at 3 pm\ 04/05/10  Yes Roderick PeeJeffrey A Todd, MD  cetirizine (ZYRTEC) 10 MG tablet Take 10 mg by mouth daily.   Yes Historical Provider, MD  HYDROcodone-acetaminophen (NORCO) 10-325 MG tablet Take 1 tablet by mouth 2 (two) times daily as needed for moderate pain or severe pain.  08/07/15  Yes Historical Provider, MD  PROPECIA 1 MG tablet TAKE ONE TABLET BY MOUTH EVERY DAY 06/22/10  Yes Roderick PeeJeffrey A Todd, MD  ALPRAZolam Prudy Feeler(XANAX) 0.5 MG tablet Take 1 tablet by mouth 3 (three) times daily as needed. 08/11/15   Historical Provider, MD  pantoprazole (PROTONIX) 40 MG tablet Take 40 mg by mouth daily. 08/11/15   Historical Provider, MD   BP 128/81 mmHg  Pulse 103  Temp(Src) 98.5 F (36.9 C) (Oral)  Resp 18  Ht 5\' 8"  (1.727 m)  Wt 192 lb (87.091 kg)  BMI 29.20 kg/m2  SpO2 96% Physical Exam  Constitutional: He appears well-developed and well-nourished. No  distress.  HENT:  Head: Normocephalic and atraumatic.  Mouth/Throat: Oropharynx is clear and moist. No oropharyngeal exudate.  Eyes: Conjunctivae and EOM are normal. Pupils are equal, round, and reactive to light. Right eye exhibits no discharge. Left eye exhibits no discharge. No scleral icterus.  Neck: Normal range of motion. Neck supple. No JVD present. No thyromegaly present.  Cardiovascular: Regular rhythm, normal heart sounds and intact distal pulses.  Exam reveals no gallop and no friction rub.   No murmur  heard. Tachycardic to 105 bpm, strong pulses, no JVD, no edema  Pulmonary/Chest: Effort normal and breath sounds normal. No respiratory distress. He has no wheezes. He has no rales.  Abdominal: Soft. Bowel sounds are normal. He exhibits no distension and no mass. There is no tenderness.  Musculoskeletal: Normal range of motion. He exhibits no edema or tenderness.  Lymphadenopathy:    He has no cervical adenopathy.  Neurological: He is alert. Coordination normal.  Skin: Skin is warm and dry. No rash noted. No erythema.  Psychiatric: He has a normal mood and affect. His behavior is normal.  Nursing note and vitals reviewed.   ED Course  Procedures (including critical care time) Labs Review Labs Reviewed  BASIC METABOLIC PANEL - Abnormal; Notable for the following:    Potassium 3.4 (*)    All other components within normal limits  CBC - Abnormal; Notable for the following:    WBC 10.6 (*)    All other components within normal limits  TSH - Abnormal; Notable for the following:    TSH 0.264 (*)    All other components within normal limits  TROPONIN I  D-DIMER, QUANTITATIVE (NOT AT [facility])  T3, FREE  T4, FREE    Imaging Review Dg Chest 2 View  08/11/2015  CLINICAL DATA:  Shortness of breath for 3 days. One day history of chest pain EXAM: CHEST  2 VIEW COMPARISON:  September 13, 2013 FINDINGS: There is no edema or consolidation. The heart size and pulmonary vascularity are normal. No adenopathy. No pneumothorax. No bone lesions. IMPRESSION: No edema or consolidation. Electronically Signed   By: Bretta Bang III M.D.   On: 08/11/2015 18:12   I have personally reviewed and evaluated these images and lab results as part of my medical decision-making.   EKG Interpretation   Date/Time:  Tuesday August 11 2015 16:55:32 EDT Ventricular Rate:  107 PR Interval:  146 QRS Duration: 86 QT Interval:  310 QTC Calculation: 413 R Axis:   8 Text Interpretation:  Sinus tachycardia Otherwise  normal ECG since last  tracing no significant change Confirmed by Gwyn Mehring  MD, Keyle Doby ([id number]) on  08/11/2015 5:05:57 PM      MDM   Final diagnoses:  Hyperthyroidism  Palpitations  Shortness of breath    The patient appears well and other than a mild tachycardia has a normal exam. His EKG is nonischemic and shows no acute findings. He is on several different substances which could cause palpitations or arrhythmias. We'll check thyroid function, cardiac function, labs, cardiac monitoring, anticipate discharge as the patient is otherwise well-appearing and not currently in an arrhythmia  Labs unremarkabl eother than TSH  Pt has slightly low TSH, otherwise, pulse is between 75 and 105 in sinus rhythm.  He was given xanax and PPI today at Dr. Sharyon Medicus office - needs close f/u tomorrow - free T4 and T3 drawn - one dose of BB - can f/u in the AM - not thyrotoxic, not in distress, d/w pt need  to stop caffeine - stop tea, pt is on Adderal - counseled - he states he needs this - can talk with PCP in the AM.  Eber Hong, MD 08/11/15 [phone]

## 2015-08-11 NOTE — ED Notes (Signed)
SOB since Saturday was in MD office and felt Chest Pressure. Points to mid center sternum. Denies nausea and no other s/s other than SOB at rest. Pt is very flushed in triage

## 2015-08-11 NOTE — ED Notes (Signed)
No chest pain at present. 

## 2015-08-11 NOTE — ED Notes (Signed)
Pt verbalizes he has a feeling of indigestion. MD made aware.

## 2015-08-12 ENCOUNTER — Telehealth: Payer: Self-pay | Admitting: *Deleted

## 2015-08-12 LAB — T4, FREE: FREE T4: 0.95 ng/dL (ref 0.61–1.12)

## 2015-08-13 LAB — T3, FREE: T3 FREE: 4 pg/mL (ref 2.0–4.4)

## 2015-08-18 ENCOUNTER — Other Ambulatory Visit (HOSPITAL_COMMUNITY): Payer: Self-pay | Admitting: Internal Medicine

## 2015-08-18 DIAGNOSIS — A492 Hemophilus influenzae infection, unspecified site: Secondary | ICD-10-CM

## 2015-08-18 DIAGNOSIS — B963 Hemophilus influenzae [H. influenzae] as the cause of diseases classified elsewhere: Secondary | ICD-10-CM

## 2015-08-18 DIAGNOSIS — J042 Acute laryngotracheitis: Principal | ICD-10-CM

## 2015-08-19 ENCOUNTER — Encounter: Payer: Self-pay | Admitting: *Deleted

## 2015-08-19 ENCOUNTER — Encounter (HOSPITAL_COMMUNITY): Payer: Self-pay | Admitting: *Deleted

## 2015-08-19 ENCOUNTER — Emergency Department (HOSPITAL_COMMUNITY): Payer: BLUE CROSS/BLUE SHIELD

## 2015-08-19 ENCOUNTER — Emergency Department (HOSPITAL_COMMUNITY)
Admission: EM | Admit: 2015-08-19 | Discharge: 2015-08-19 | Disposition: A | Discharge status: Home / Self Care | Payer: BLUE CROSS/BLUE SHIELD | Attending: Emergency Medicine | Admitting: Emergency Medicine

## 2015-08-19 DIAGNOSIS — R0789 Other chest pain: Secondary | ICD-10-CM | POA: Diagnosis present

## 2015-08-19 DIAGNOSIS — R002 Palpitations: Secondary | ICD-10-CM | POA: Diagnosis not present

## 2015-08-19 DIAGNOSIS — R0602 Shortness of breath: Secondary | ICD-10-CM | POA: Insufficient documentation

## 2015-08-19 DIAGNOSIS — R0981 Nasal congestion: Secondary | ICD-10-CM

## 2015-08-19 DIAGNOSIS — R5383 Other fatigue: Secondary | ICD-10-CM | POA: Insufficient documentation

## 2015-08-19 DIAGNOSIS — E785 Hyperlipidemia, unspecified: Secondary | ICD-10-CM | POA: Insufficient documentation

## 2015-08-19 LAB — BASIC METABOLIC PANEL
ANION GAP: 5 (ref 5–15)
BUN: 14 mg/dL (ref 6–20)
CALCIUM: 9.3 mg/dL (ref 8.9–10.3)
CO2: 26 mmol/L (ref 22–32)
Chloride: 105 mmol/L (ref 101–111)
Creatinine, Ser: 0.83 mg/dL (ref 0.61–1.24)
GLUCOSE: 109 mg/dL — AB (ref 65–99)
POTASSIUM: 3.9 mmol/L (ref 3.5–5.1)
SODIUM: 136 mmol/L (ref 135–145)

## 2015-08-19 LAB — CBC
HEMATOCRIT: 44.9 % (ref 39.0–52.0)
HEMOGLOBIN: 15.2 g/dL (ref 13.0–17.0)
MCH: 29 pg (ref 26.0–34.0)
MCHC: 33.9 g/dL (ref 30.0–36.0)
MCV: 85.7 fL (ref 78.0–100.0)
Platelets: 307 10*3/uL (ref 150–400)
RBC: 5.24 MIL/uL (ref 4.22–5.81)
RDW: 12 % (ref 11.5–15.5)
WBC: 8.7 10*3/uL (ref 4.0–10.5)

## 2015-08-19 LAB — I-STAT TROPONIN, ED: TROPONIN I, POC: 0 ng/mL (ref 0.00–0.08)

## 2015-08-19 MED ORDER — TRIAMCINOLONE ACETONIDE 55 MCG/ACT NA AERO: 2.0000 | INHALATION_SPRAY | Freq: Every day | NASAL | 12 refills | Status: AC

## 2015-08-19 MED ORDER — METOPROLOL TARTRATE 25 MG PO TABS: 25.0000 mg | ORAL_TABLET | Freq: Two times a day (BID) | ORAL | 0 refills | Status: DC
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
## 2015-08-19 NOTE — Discharge Instructions (Signed)
Keep your appointment next week with the ears nose and throat doctor that Dr. Sherwood Gambler has already arranged. Stop the Flonase and start taking the Nasacort nasal spray for your nasal congestion. Increase her metoprolol to twice a day for your palpitations. Call Dr. Sharene Skeans office to get an appointment to evaluate your palpitations. He is a cardiologist.

## 2015-08-19 NOTE — ED Triage Notes (Signed)
Pt c/o central chest pain that started x 2 hours ago and describes the pain as a pressure; pt states lying down makes it worse; pt states he has some sob and lightheadedness with the pain

## 2015-08-25 ENCOUNTER — Ambulatory Visit
Admission: RE | Admit: 2015-08-25 | Discharge: 2015-08-25 | Disposition: A | Discharge status: Home / Self Care | Payer: BLUE CROSS/BLUE SHIELD | Source: Ambulatory Visit | Attending: Internal Medicine | Admitting: Internal Medicine

## 2015-08-25 DIAGNOSIS — K449 Diaphragmatic hernia without obstruction or gangrene: Secondary | ICD-10-CM | POA: Insufficient documentation

## 2015-08-25 DIAGNOSIS — B963 Hemophilus influenzae [H. influenzae] as the cause of diseases classified elsewhere: Secondary | ICD-10-CM

## 2015-08-25 DIAGNOSIS — J042 Acute laryngotracheitis: Secondary | ICD-10-CM | POA: Diagnosis present

## 2015-08-25 DIAGNOSIS — A492 Hemophilus influenzae infection, unspecified site: Secondary | ICD-10-CM

## 2015-09-15 NOTE — Progress Notes (Deleted)
Cardiology Office Note   Date:  09/16/2015   ID:  Steven Murray, DOB [birth year], MRN [id number]  PCP:  Cassell SmilesFUSCO,LAWRENCE J., MD  Cardiologist:   Charlton HawsPeter Deadrick Stidd, MD   No chief complaint on file.     History of Present Illness: Steven Murray is a 40 y.o. male seen in ER 08/19/11 complaining of gradually worsening, intermittent, central chest pain, chest tightness, and shortness of breath that began 08/09/15  and worsened a few hours PTA. Pt describes his shortness of breath as difficulty breathing through his nose due to restricted airways. He states he has used Systems analystlonase and Afrin tonight with no relief; he reports he uses Flonase daily for at least the past year. He also has been on Zyrtec over-the-counter daily for the past year. Pt notes associated fatigue due to difficulty sleeping at night and intermittent episodes of heart racing in which his heart rate rate ranges from 105-130. Pt reports he was seen in the ED on 7/18 for similar symptoms in which he was prescribed metoprolol 25 mg daily. He states he takes in the morning however by late afternoon he is aware of his heart starting to race again. He notes he has not been referred to a cardiologist but scheduled an appointment with an ENT today for 8/1. Pt states he was told at his last ED visit that his symptoms could have stemmed from a panic attack. He denies hx of anxiety. He also denies coughing, wheezing, hx of smoking or drinking EtOH, and FHx of heart problems. His PCP checked  thyroid tests and he was told they were normal. Pt reports he is trying to limit himself to 1 cup of sweet tea and limited caffeine a day. He states  prior to coming in ER he awakened and felt like he couldn't breathe which caused him to have a panic attack. He denies coughing, wheezing, but states he has shortness of breath only with the episodes of feeling his heart is racing. He complains of being exhausted. He denies any new stress.  CXR reviewed 08/19/15  NAD  no CE  Barrium Swallow 08/25/15 with small hiatal hernia recommended direct visualization of pharynx And vocal cords to see if any changes seen that would be consistent with reflux or sinus drainage  Lab Results  Component Value Date   TSH 0.264 (L) 08/11/2015   T4 normal .95 same blood draw   Past Medical History:  Diagnosis Date  . Anxiety disorder   . Atypical chest pain   . B12 deficiency   . Chronic pain   . Chronic sinusitis   . GERD (gastroesophageal reflux disease)   . Hyperlipidemia   . Hypogonadism in male   . Laryngopharyngeal reflux   . Orthopnea     No past surgical history on file.   Current Outpatient Prescriptions  Medication Sig Dispense Refill  . ALPRAZolam (XANAX) 0.5 MG tablet Take 1 tablet by mouth 3 (three) times daily as needed.  1  . amphetamine-dextroamphetamine (ADDERALL XR, 20MG ,) 20 MG 24 hr capsule Two tabs in the morning (Patient taking differently: Take 40 mg by mouth every morning. ) 60 capsule 0  . amphetamine-dextroamphetamine (ADDERALL, 20MG ,) 20 MG tablet One tab at 3 pm (Patient taking differently: Take 20 mg by mouth daily. One tab at 3 pm\) 30 tablet 0  . cetirizine (ZYRTEC) 10 MG tablet Take 10 mg by mouth daily.    Marland Kitchen. HYDROcodone-acetaminophen (NORCO) 10-325 MG tablet Take 1 tablet by mouth  2 (two) times daily as needed for moderate pain or severe pain.   0  . metoprolol (LOPRESSOR) 25 MG tablet Take 1 tablet (25 mg total) by mouth 2 (two) times daily. 60 tablet 0  . pantoprazole (PROTONIX) 40 MG tablet Take 40 mg by mouth daily.  10  . PROPECIA 1 MG tablet TAKE ONE TABLET BY MOUTH EVERY DAY 30 each 0  . triamcinolone (NASACORT AQ) 55 MCG/ACT AERO nasal inhaler Place 2 sprays into the nose daily. 1 Inhaler 12   No current facility-administered medications for this visit.     Allergies:   Penicillins    Social History:  The patient  reports that he has never smoked. He has never used smokeless tobacco. He reports that he does not drink  alcohol or use drugs.   Family History:  The patient's family history negative for premature CAD    ROS:  Please see the history of present illness.   Otherwise, review of systems are positive for {NONE DEFAULTED:18576::"none"}.   All other systems are reviewed and negative.    PHYSICAL EXAM: VS:  There were no vitals taken for this visit. , BMI There is no height or weight on file to calculate BMI. Affect appropriate Healthy:  appears stated age HEENT: normal Neck supple with no adenopathy JVP normal no bruits no thyromegaly Lungs clear with no wheezing and good diaphragmatic motion Heart:  S1/S2 no murmur, no rub, gallop or click PMI normal Abdomen: benighn, BS positve, no tenderness, no AAA no bruit.  No HSM or HJR Distal pulses intact with no bruits No edema Neuro non-focal Skin warm and dry No muscular weakness    ECG:   08/19/15  SR rate 76 normal ECG    Recent Labs: 08/11/2015: TSH 0.264 08/19/2015: BUN 14; Creatinine, Ser 0.83; Hemoglobin 15.2; Platelets 307; Potassium 3.9; Sodium 136    Lipid Panel No results found for: CHOL, TRIG, HDL, CHOLHDL, VLDL, LDLCALC, LDLDIRECT    Wt Readings from Last 3 Encounters:  08/19/15 190 lb (86.2 kg)  08/11/15 192 lb (87.1 kg)  09/12/13 178 lb (80.7 kg)      Other studies Reviewed: Additional studies/ records that were reviewed today include: ***.    ASSESSMENT AND PLAN:  1.  Palpitations 2. Dyspnea : 3. Chest Pain    Current medicines are reviewed at length with the patient today.  The patient {ACTIONS; HAS/DOES NOT HAVE:19233} concerns regarding medicines.  The following changes have been made:  {PLAN; NO CHANGE:13088:s}  Labs/ tests ordered today include: *** No orders of the defined types were placed in this encounter.    Disposition:   FU with ***     Signed, Charlton HawsPeter Leila Schuff, MD  09/16/2015 11:02 PM    [facility] [address], [place], [place]  [address] Phone: [phone];  Fax: [phone]

## 2015-09-17 ENCOUNTER — Encounter: Payer: Self-pay | Admitting: Cardiovascular Disease

## 2015-09-17 ENCOUNTER — Encounter: Payer: BLUE CROSS/BLUE SHIELD | Admitting: Cardiovascular Disease

## 2015-09-24 ENCOUNTER — Ambulatory Visit (INDEPENDENT_AMBULATORY_CARE_PROVIDER_SITE_OTHER): Payer: BLUE CROSS/BLUE SHIELD | Admitting: Otolaryngology

## 2015-10-12 ENCOUNTER — Ambulatory Visit (INDEPENDENT_AMBULATORY_CARE_PROVIDER_SITE_OTHER): Payer: BLUE CROSS/BLUE SHIELD | Admitting: Otolaryngology

## 2015-10-12 DIAGNOSIS — J310 Chronic rhinitis: Secondary | ICD-10-CM | POA: Diagnosis not present

## 2015-10-12 DIAGNOSIS — J342 Deviated nasal septum: Secondary | ICD-10-CM | POA: Diagnosis not present

## 2015-10-12 DIAGNOSIS — R1312 Dysphagia, oropharyngeal phase: Secondary | ICD-10-CM

## 2015-10-12 DIAGNOSIS — J343 Hypertrophy of nasal turbinates: Secondary | ICD-10-CM

## 2015-10-28 ENCOUNTER — Other Ambulatory Visit (HOSPITAL_COMMUNITY): Payer: Self-pay | Admitting: Internal Medicine

## 2015-10-29 ENCOUNTER — Other Ambulatory Visit (HOSPITAL_COMMUNITY): Payer: Self-pay | Admitting: Internal Medicine

## 2015-10-30 ENCOUNTER — Other Ambulatory Visit (HOSPITAL_COMMUNITY): Payer: Self-pay | Admitting: Internal Medicine

## 2015-10-30 DIAGNOSIS — N6320 Unspecified lump in the left breast, unspecified quadrant: Secondary | ICD-10-CM

## 2015-11-03 ENCOUNTER — Other Ambulatory Visit (HOSPITAL_COMMUNITY): Payer: Self-pay | Admitting: Internal Medicine

## 2015-11-03 DIAGNOSIS — N6320 Unspecified lump in the left breast, unspecified quadrant: Secondary | ICD-10-CM

## 2015-11-09 ENCOUNTER — Ambulatory Visit (INDEPENDENT_AMBULATORY_CARE_PROVIDER_SITE_OTHER): Payer: BLUE CROSS/BLUE SHIELD | Admitting: Otolaryngology

## 2015-11-10 ENCOUNTER — Encounter (HOSPITAL_COMMUNITY): Payer: BLUE CROSS/BLUE SHIELD

## 2015-12-15 ENCOUNTER — Encounter (HOSPITAL_COMMUNITY): Payer: BLUE CROSS/BLUE SHIELD

## 2016-02-29 ENCOUNTER — Ambulatory Visit (INDEPENDENT_AMBULATORY_CARE_PROVIDER_SITE_OTHER): Payer: BLUE CROSS/BLUE SHIELD | Admitting: Otolaryngology

## 2016-03-23 ENCOUNTER — Ambulatory Visit
Admission: RE | Admit: 2016-03-23 | Discharge: 2016-03-23 | Disposition: A | Discharge status: Home / Self Care | Payer: BLUE CROSS/BLUE SHIELD | Source: Ambulatory Visit | Attending: Internal Medicine | Admitting: Internal Medicine

## 2016-03-23 DIAGNOSIS — N6320 Unspecified lump in the left breast, unspecified quadrant: Secondary | ICD-10-CM

## 2016-04-05 ENCOUNTER — Encounter (HOSPITAL_COMMUNITY): Payer: BLUE CROSS/BLUE SHIELD

## 2016-04-10 ENCOUNTER — Emergency Department (HOSPITAL_COMMUNITY): Payer: BLUE CROSS/BLUE SHIELD

## 2016-04-10 ENCOUNTER — Encounter (HOSPITAL_COMMUNITY): Payer: Self-pay

## 2016-04-10 ENCOUNTER — Emergency Department (HOSPITAL_COMMUNITY)
Admission: EM | Admit: 2016-04-10 | Discharge: 2016-04-10 | Disposition: A | Discharge status: Home / Self Care | Payer: BLUE CROSS/BLUE SHIELD | Attending: Emergency Medicine | Admitting: Emergency Medicine

## 2016-04-10 DIAGNOSIS — R0600 Dyspnea, unspecified: Secondary | ICD-10-CM

## 2016-04-10 DIAGNOSIS — F419 Anxiety disorder, unspecified: Secondary | ICD-10-CM | POA: Diagnosis not present

## 2016-04-10 DIAGNOSIS — I10 Essential (primary) hypertension: Secondary | ICD-10-CM | POA: Diagnosis not present

## 2016-04-10 DIAGNOSIS — R0602 Shortness of breath: Secondary | ICD-10-CM | POA: Diagnosis present

## 2016-04-10 LAB — CBC WITH DIFFERENTIAL/PLATELET
BASOS ABS: 0 10*3/uL (ref 0.0–0.1)
BASOS PCT: 0 %
EOS PCT: 1 %
Eosinophils Absolute: 0.1 10*3/uL (ref 0.0–0.7)
HCT: 47.4 % (ref 39.0–52.0)
Hemoglobin: 16.1 g/dL (ref 13.0–17.0)
Lymphocytes Relative: 14 %
Lymphs Abs: 1.4 10*3/uL (ref 0.7–4.0)
MCH: 30.3 pg (ref 26.0–34.0)
MCHC: 34 g/dL (ref 30.0–36.0)
MCV: 89.1 fL (ref 78.0–100.0)
Monocytes Absolute: 1.3 10*3/uL — ABNORMAL HIGH (ref 0.1–1.0)
Monocytes Relative: 13 %
Neutro Abs: 7.1 10*3/uL (ref 1.7–7.7)
Neutrophils Relative %: 72 %
PLATELETS: 288 10*3/uL (ref 150–400)
RBC: 5.32 MIL/uL (ref 4.22–5.81)
RDW: 13.8 % (ref 11.5–15.5)
WBC: 10 10*3/uL (ref 4.0–10.5)

## 2016-04-10 LAB — BASIC METABOLIC PANEL
Anion gap: 9 (ref 5–15)
BUN: 6 mg/dL (ref 6–20)
CALCIUM: 9.6 mg/dL (ref 8.9–10.3)
CO2: 25 mmol/L (ref 22–32)
Chloride: 102 mmol/L (ref 101–111)
Creatinine, Ser: 0.76 mg/dL (ref 0.61–1.24)
GFR calc Af Amer: >60 mL/min (ref >60)
GLUCOSE: 95 mg/dL (ref 65–99)
Potassium: 3.7 mmol/L (ref 3.5–5.1)
Sodium: 136 mmol/L (ref 135–145)

## 2016-04-10 LAB — TROPONIN I: Troponin I: <0.03 ng/mL (ref <0.03)

## 2016-04-10 MED ORDER — DIAZEPAM 2 MG PO TABS
2.0000 mg | ORAL_TABLET | Freq: Once | ORAL | Status: AC
  Administered 2016-04-10: 2 mg via ORAL
  Filled 2016-04-10: qty 1

## 2016-04-10 MED ORDER — GI COCKTAIL ~~LOC~~
30.0000 mL | Freq: Once | ORAL | Status: AC
  Administered 2016-04-10: 30 mL via ORAL
  Filled 2016-04-10: qty 30

## 2016-04-10 MED ORDER — ASPIRIN 81 MG PO CHEW
324.0000 mg | CHEWABLE_TABLET | Freq: Once | ORAL | Status: AC
  Administered 2016-04-10: 324 mg via ORAL
  Filled 2016-04-10: qty 4

## 2016-04-10 MED ORDER — DIAZEPAM 5 MG PO TABS
5.0000 mg | ORAL_TABLET | Freq: Once | ORAL | Status: AC
Start: 2016-04-10 — End: 2016-04-10
  Administered 2016-04-10: 5 mg via ORAL
  Filled 2016-04-10: qty 1
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
## 2016-04-10 NOTE — Discharge Instructions (Signed)
Please return to the ER if you have worsening chest pain, shortness of breath, pain radiating to your jaw, shoulder, or back, sweats or fainting.

## 2016-04-10 NOTE — ED Triage Notes (Signed)
Pt complaining of hypertension and flushed sensation. Pt states take BP medication as needed. Pt states metoprolol rx at home.

## 2016-04-10 NOTE — ED Notes (Signed)
Pt verbalized understanding of discharge instructions and follow up care. Denies pain at this time, A&O x 4, ambulatory, VSS.

## 2016-06-09 ENCOUNTER — Ambulatory Visit: Payer: BLUE CROSS/BLUE SHIELD | Admitting: General Surgery

## 2016-11-15 ENCOUNTER — Other Ambulatory Visit: Payer: Self-pay

## 2016-11-15 ENCOUNTER — Emergency Department (HOSPITAL_COMMUNITY)
Admission: EM | Admit: 2016-11-15 | Discharge: 2016-11-15 | Disposition: A | Discharge status: Home / Self Care | Payer: BLUE CROSS/BLUE SHIELD | Source: Home / Self Care | Attending: Emergency Medicine | Admitting: Emergency Medicine

## 2016-11-15 ENCOUNTER — Encounter (HOSPITAL_COMMUNITY): Payer: Self-pay

## 2016-11-15 ENCOUNTER — Emergency Department (HOSPITAL_COMMUNITY)
Admission: EM | Admit: 2016-11-15 | Discharge: 2016-11-15 | Disposition: A | Discharge status: Home / Self Care | Payer: BLUE CROSS/BLUE SHIELD | Attending: Emergency Medicine | Admitting: Emergency Medicine

## 2016-11-15 ENCOUNTER — Emergency Department (HOSPITAL_COMMUNITY): Payer: BLUE CROSS/BLUE SHIELD

## 2016-11-15 DIAGNOSIS — R58 Hemorrhage, not elsewhere classified: Secondary | ICD-10-CM | POA: Insufficient documentation

## 2016-11-15 DIAGNOSIS — Z9089 Acquired absence of other organs: Secondary | ICD-10-CM | POA: Insufficient documentation

## 2016-11-15 DIAGNOSIS — Z79899 Other long term (current) drug therapy: Secondary | ICD-10-CM

## 2016-11-15 DIAGNOSIS — R51 Headache: Secondary | ICD-10-CM | POA: Diagnosis present

## 2016-11-15 DIAGNOSIS — Z880 Allergy status to penicillin: Secondary | ICD-10-CM

## 2016-11-15 DIAGNOSIS — R519 Headache, unspecified: Secondary | ICD-10-CM

## 2016-11-15 LAB — CBC WITH DIFFERENTIAL/PLATELET
Basophils Absolute: 0 10*3/uL (ref 0.0–0.1)
Basophils Relative: 0 %
EOS ABS: 0 10*3/uL (ref 0.0–0.7)
EOS PCT: 0 %
HCT: 48 % (ref 39.0–52.0)
Hemoglobin: 16.5 g/dL (ref 13.0–17.0)
LYMPHS ABS: 0.7 10*3/uL (ref 0.7–4.0)
Lymphocytes Relative: 6 %
MCH: 29.5 pg (ref 26.0–34.0)
MCHC: 34.4 g/dL (ref 30.0–36.0)
MCV: 85.7 fL (ref 78.0–100.0)
MONOS PCT: 1 %
Monocytes Absolute: 0.1 10*3/uL (ref 0.1–1.0)
Neutro Abs: 10.6 10*3/uL — ABNORMAL HIGH (ref 1.7–7.7)
Neutrophils Relative %: 93 %
Platelets: 328 10*3/uL (ref 150–400)
RBC: 5.6 MIL/uL (ref 4.22–5.81)
RDW: 12.7 % (ref 11.5–15.5)
WBC: 11.4 10*3/uL — AB (ref 4.0–10.5)

## 2016-11-15 LAB — COMPREHENSIVE METABOLIC PANEL
ALT: 34 U/L (ref 17–63)
ANION GAP: 9 (ref 5–15)
AST: 27 U/L (ref 15–41)
Albumin: 3.8 g/dL (ref 3.5–5.0)
Alkaline Phosphatase: 77 U/L (ref 38–126)
BUN: 6 mg/dL (ref 6–20)
CO2: 25 mmol/L (ref 22–32)
Calcium: 9.7 mg/dL (ref 8.9–10.3)
Chloride: 100 mmol/L — ABNORMAL LOW (ref 101–111)
Creatinine, Ser: 0.78 mg/dL (ref 0.61–1.24)
GFR calc non Af Amer: >60 mL/min (ref >60)
Glucose, Bld: 163 mg/dL — ABNORMAL HIGH (ref 65–99)
POTASSIUM: 4.3 mmol/L (ref 3.5–5.1)
SODIUM: 134 mmol/L — AB (ref 135–145)
Total Bilirubin: 0.6 mg/dL (ref 0.3–1.2)
Total Protein: 7.6 g/dL (ref 6.5–8.1)

## 2016-11-15 LAB — I-STAT CHEM 8, ED
BUN: 8 mg/dL (ref 6–20)
CALCIUM ION: 1.16 mmol/L (ref 1.15–1.40)
Chloride: 99 mmol/L — ABNORMAL LOW (ref 101–111)
Creatinine, Ser: 0.8 mg/dL (ref 0.61–1.24)
Glucose, Bld: 114 mg/dL — ABNORMAL HIGH (ref 65–99)
HCT: 47 % (ref 39.0–52.0)
Hemoglobin: 16 g/dL (ref 13.0–17.0)
Potassium: 4.5 mmol/L (ref 3.5–5.1)
Sodium: 137 mmol/L (ref 135–145)
TCO2: 30 mmol/L (ref 22–32)

## 2016-11-15 MED ORDER — IOPAMIDOL (ISOVUE-370) INJECTION 76%
INTRAVENOUS | Status: AC
  Filled 2016-11-15: qty 50

## 2016-11-15 MED ORDER — GABAPENTIN 300 MG PO CAPS: 300.0000 mg | ORAL_CAPSULE | Freq: Three times a day (TID) | ORAL | 0 refills | Status: AC

## 2016-11-15 MED ORDER — SODIUM CHLORIDE 0.9 % IV BOLUS (SEPSIS)
1000.0000 mL | Freq: Once | INTRAVENOUS | Status: AC
  Administered 2016-11-15: 1000 mL via INTRAVENOUS

## 2016-11-15 MED ORDER — DIAZEPAM 5 MG PO TABS
5.0000 mg | ORAL_TABLET | Freq: Two times a day (BID) | ORAL | 0 refills | Status: DC
Start: 2016-11-15 — End: 2016-12-06

## 2016-11-15 MED ORDER — LORAZEPAM 1 MG PO TABS
1.0000 mg | ORAL_TABLET | Freq: Once | ORAL | Status: AC
  Administered 2016-11-15: 1 mg via ORAL
  Filled 2016-11-15: qty 1

## 2016-11-15 MED ORDER — DIPHENHYDRAMINE HCL 50 MG/ML IJ SOLN
25.0000 mg | Freq: Once | INTRAMUSCULAR | Status: AC
  Administered 2016-11-15: 25 mg via INTRAVENOUS
  Filled 2016-11-15: qty 1

## 2016-11-15 MED ORDER — MORPHINE SULFATE (PF) 4 MG/ML IV SOLN
4.0000 mg | Freq: Once | INTRAVENOUS | Status: AC
  Administered 2016-11-15: 4 mg via INTRAMUSCULAR
  Filled 2016-11-15: qty 1

## 2016-11-15 MED ORDER — DIAZEPAM 5 MG PO TABS
5.0000 mg | ORAL_TABLET | Freq: Once | ORAL | Status: AC
  Administered 2016-11-15: 5 mg via ORAL
  Filled 2016-11-15: qty 1

## 2016-11-15 MED ORDER — ACETAMINOPHEN 500 MG PO TABS: 1000.0000 mg | ORAL_TABLET | Freq: Once | ORAL | Status: DC

## 2016-11-15 MED ORDER — METOPROLOL TARTRATE 5 MG/5ML IV SOLN
5.0000 mg | Freq: Once | INTRAVENOUS | Status: AC
  Administered 2016-11-15: 5 mg via INTRAVENOUS
  Filled 2016-11-15: qty 5

## 2016-11-15 MED ORDER — DEXAMETHASONE SODIUM PHOSPHATE 10 MG/ML IJ SOLN
10.0000 mg | Freq: Once | INTRAMUSCULAR | Status: AC
  Administered 2016-11-15: 10 mg via INTRAVENOUS
  Filled 2016-11-15: qty 1

## 2016-11-15 MED ORDER — PROCHLORPERAZINE EDISYLATE 5 MG/ML IJ SOLN
10.0000 mg | Freq: Once | INTRAMUSCULAR | Status: AC
  Administered 2016-11-15: 10 mg via INTRAVENOUS
  Filled 2016-11-15: qty 2

## 2016-11-15 MED ORDER — ONDANSETRON 8 MG PO TBDP
8.0000 mg | ORAL_TABLET | Freq: Once | ORAL | Status: AC
Start: 2016-11-15 — End: 2016-11-15
  Administered 2016-11-15: 8 mg via ORAL
  Filled 2016-11-15: qty 1

## 2016-11-15 NOTE — ED Notes (Signed)
ED Provider at bedside. 

## 2016-11-15 NOTE — ED Triage Notes (Signed)
Pt reports he can not tol. CT because he can tol. Position for CT

## 2016-11-15 NOTE — Discharge Instructions (Signed)
You presented to ED for severe headache. Neurology evaluated you and recommended a CT angiogram of head and neck. You declined this today. Your headache improved after medications.   Take gabapentin as prescribed for 2 days.   Follow up with neurology as outpatient if headaches reoccur.   Return to ED if symptoms worsen.

## 2016-11-15 NOTE — ED Notes (Signed)
Pt departed in NAD, escorted in wheelchair by this RN.  

## 2016-11-15 NOTE — Discharge Instructions (Signed)
Take your usual prescriptions as previously directed.  Continue to drink cool/cold fluids.  Go to your ENT doctor's office today at 12:30pm to be seen in follow up.  Return to the Emergency Department immediately sooner if worsening.
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
## 2016-11-15 NOTE — ED Triage Notes (Signed)
Wife reports pt had tonsils out and surgery to repair deviated septum last Wednesday.  Last night pt started vomiting blood.  Wife says he vomited several times.  Reports started vomiting blood again this morning.

## 2016-11-15 NOTE — Consult Note (Signed)
Neurology Consultation Reason for Consult: Headache Referring Physician: Sharen Heck, PA-C  CC: Headache  History is obtained from: Patient, his wife, EDP  HPI: Steven Murray is a 41 y.o. male with a history of anxiety disorder, GERD, ADHD, hypogonadism on testosterone supplementation who recently underwent tonsillectomy and deviated septum repair on 10/17 who presents today due to new severe left sided headache since waking up this morning. There was associated nausea and vomiting with bright red blood early this morning. His nausea and vomiting is improved but headache persists. He felt unsteady on his feet due to symptoms but denies vertigo or diplopia. He has occasionally had headaches in the past but never this severe or with associated nausea and vomiting. He does not report vision changes or focal sensory changes. Since his surgery last week he was experiencing pain partially controlled on a 5 day course of oral pain medication. He initially presented to the [facility] ED and was instructed to follow up with his ENT clinic immediately. On presentation to the ENT clinic he was recommended to be evaluated at the [facility] ED by Neurology for this new severe headache. He received treatment with decadron, benadryl, compazine, and diazepam with partial relief of pain here.    ROS: A 14 point ROS was performed and is negative except as noted in the HPI.  Past Medical History:  Diagnosis Date  . Anxiety disorder   . Atypical chest pain   . B12 deficiency   . Chronic pain   . Chronic sinusitis   . GERD (gastroesophageal reflux disease)   . Hyperlipidemia   . Hypogonadism in male   . Laryngopharyngeal reflux   . Orthopnea     No family history on file. Reports a family history of Multiple Sclerosis on his mother's sde  Social History:  reports that he has never smoked. He has never used smokeless tobacco. He reports that he does not drink alcohol or use  drugs.   Exam: Current vital signs: BP (!) 138/103 (BP Location: Right Arm)   Pulse 100   Temp 97.8 F (36.6 C) (Oral)   Resp 18   Wt 178 lb (80.7 kg)   SpO2 100%   BMI 27.06 kg/m  Vital signs in last 24 hours: Temp:  [97.8 F (36.6 C)-98.3 F (36.8 C)] 97.8 F (36.6 C) (10/23 1230) Pulse Rate:  [96-102] 100 (10/23 1230) Resp:  [18] 18 (10/23 1230) BP: (138-148)/(99-105) 138/103 (10/23 1230) SpO2:  [96 %-100 %] 100 % (10/23 1230) Weight:  [178 lb (80.7 kg)] 178 lb (80.7 kg) (10/23 1230)   Physical Exam  Constitutional: Appears well-developed and well-nourished.  Psych: He appears uncomfortable but pleasant and cooperative Eyes: No scleral injection HENT: Inflammatory postsurgical changes in oropharynx, no exudate Head: Tenderness with direct palpation over frontal and maxillary sinuses on left Neck: Neck is supple and nontender, no palpable adenopathy, no JVD Cardiovascular: Normal rate and regular rhythm Respiratory: Effort normal and breath sounds normal to anterior ascultation Skin: WDI  Neuro: Mental Status: Patient is drowsy but awakens to voice and remains awake, oriented x3 Patient is able to give a clear and coherent history. No signs of aphasia or neglect Cranial Nerves: II: Visual Fields are full. Pupils are equal, round, and reactive to light. Photosensitivity. III,IV, VI: EOMI without ptosis or diploplia.  V: Facial sensation is symmetric to temperature VII: Facial movement is symmetric.  VIII: hearing is intact to voice X: Uvula elevates symmetrically XI: Shoulder shrug is symmetric. XII:  tongue is midline without atrophy or fasciculations.  Motor: Tone is normal. Bulk is normal. 5/5 strength was present in all four extremities. Sensory: Sensation is symmetric to light touch in the arms and legs. Cerebellar: FNF and HKS are intact bilaterally    I have reviewed labs in epic and the results pertinent to this consultation are: Serum Creatinine  0.80  I have reviewed the images obtained: None  Impression: 41 y/o man with recent ENT surgery presents with headache highly consistent with migraine. Symptoms are unilateral, pounding, associated blurred vision, nausea, vomiting, and photophobia. The only red flag is no major headache history although he does admit to rare headaches in the past, dissimilar to this. There is a much less likely risk of a cavernous sinus venous thrombosis he could be predisposed to after recent surgery. CT angiogram with venous phase study could evaluate this although the patient reports concern getting this study due to claustrophobia and shortness of breath lying flat currently.  Recommendations: 1) Continue symptomatic treatment for severe migraine, recommend against admission given partial improvement on treatment 2) CTA venous phase study would rule out venous thrombosis (low pre-test probability)  For full impression and recommendations see attending physician attestation.  Fuller Planhristopher W Areonna Bran, MD PGY-III Internal Medicine Resident Pager# [phone] 11/15/2016, 4:37 PM

## 2016-11-15 NOTE — ED Provider Notes (Signed)
Medical screening examination/treatment/procedure(s) were conducted as a shared visit with non-physician practitioner(s) and myself.  I personally evaluated the patient during the encounter.   EKG Interpretation None       Patient seen by me along with the physician assistant.  Patient is one-week status post ear nose and throat surgery.  Tonsillectomy as well as turbinate surgery.  And a septoplasty.  Patient went to [facility] emergency department this morning for some bleeding problems.  Patient vomited some blood.  Felt that he had been swallowing blood.  Patient from there went and was seen by his ear nose and throat doctor.  Patient with some dizziness.  ENT contacted neurology Dr. [name] patient eventually was sent here although Dr. Luisa HartPatrick did not feel that they need to be sent Dr. Luisa HartPatrick saw the patient recommended CT angios.  The patient is not able to lay flat so this could not be done.  Dr. Luisa HartPatrick put a note in stating that it was okay CT angios did not need to be done.  Highly unlikely the patient has had a CVA.  Patient unable to lay flat because of the surgery and he gets a choking feeling.  He has had to sleep upright since the surgery.  They were told that this will eventually heal and he will be able to lay flat again.  Patient noted to have some tachycardia here.  Recommended the patient get IV fluids and have a repeat his hemoglobin hematocrit from this morning.  This morning hemoglobin was 16.  Patient without any further bleeding and ear nose and throat doctor did cauterize some areas of bleeding.  Just wanted to make sure the patient was not continued to bleed from another source perhaps a GI source and was not related to the ear nose and throat area.  Patient's repeat hemoglobin is still 16.  Patient received IV fluids.  Patient has a history of panic attacks.  Without me there is a component of that so patient was treated with Ativan.  Patient should be stable for  discharge home.  Discussed with his wife.  On exam patient without any active bleeding.  Patient's heart rate in the low 100s and sinus.  Heart regular lungs clear abdomen soft no focal neuro deficit.   [name], Tashiya Souders, MD 11/15/16 2132

## 2016-11-15 NOTE — ED Notes (Signed)
Pt signed for discharge & arm band removed. Discussed extensively with pt and family that pt v/s were beginning to normalize and within acceptable limits. Reviewed work that had been done to this point, including blood work, medications, and consultation with other providers. Discussed that without doing the CT angio, there was little else we could do diagnostically. Margarette AsalGibbons, PA speaking with pt & family as well. Pt reporting more dizziness now. Margarette AsalGibbons consulting with [name], MD for additional options.
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
## 2016-11-15 NOTE — ED Triage Notes (Signed)
Pt was seen at [facility] this morning for post-op migraine with bloody emesis. Pt was discharged to follow-up with ENT STAT- ENT spoke to Dr. [name] and [facility] ED to be [facility]

## 2016-11-15 NOTE — ED Triage Notes (Signed)
Pt arrives to ED from Dr. Lajoyce Lauberrosley's office with complaints of migraine that began this morning ar 0400. PT had tonsillectomy, deviated septum repair, and bilateral turbinate reductions 11/09/16. He woke up this morning at 0400 vomiting blood clots and with severe migraine. [facility] and d/c and told to follow up at ENT at 12. When he arrived to Dr. Lajoyce Lauberrosley's office at 12 they were told to come here immediatly and Dr. [name] was already consulted. NIH and VAN negative in triage. Endorses blurred vision and photosensitivity. In triage pt admits to panic attacks and chest tightness with the migraine

## 2016-11-15 NOTE — ED Provider Notes (Signed)
Patient signed out to me by [name], PA-C.  Please see previous notes for further history.  In brief, patient presented today with new headache after ENT surgery last week and bloody emesis this morning.  He has been evaluated by Dr. [name], his ENT, and Dr. [name] with neurology.  He was recommended to get a CT angios head and neck, but the patient states he is unable to do so due to anxiety and refuses.  He was given a migraine cocktail with complete resolution of his headache.  He is tachycardic around 110.  Case discussed with attending, Dr. [name] evaluated the pt. patient is finishing IV fluids and basic labs have been ordered.  After fluids were finished, heart rate has not improved significantly.  Patient reports he is still pain-free.  Basic labs without concerning abnormality including change in hemoglobin.  Shows slight dehydration.  Discussed importance of hydration with patient.  At this time, patient appears safe for discharge.  Return precautions given.  Patient states he understands and agrees to plan.     [name], [name], PA-C 11/16/16 0221    [name], [name], MD 11/17/16 [phone]

## 2016-11-16 ENCOUNTER — Telehealth: Payer: Self-pay | Admitting: Family Medicine

## 2016-11-16 NOTE — Telephone Encounter (Signed)
Pt would like to re-est with dr todd. Pt last seen dr Tawanna Coolertodd 2012. Can I sch?

## 2016-11-16 NOTE — Telephone Encounter (Signed)
Attempted to reach the pt but no option to leave him a voice message on hs phone.

## 2016-11-18 NOTE — Telephone Encounter (Signed)
Dr Tawanna Coolerodd states that he is not accepting new pt.

## 2016-11-18 NOTE — Telephone Encounter (Signed)
Left a message on pt phone to return my call in the office

## 2016-11-23 NOTE — Telephone Encounter (Signed)
Spoke with pt wife voiced understanding that dr Tawanna Coolerodd is currently working only 21/2 days a week and is not accepting new patients.

## 2016-12-06 ENCOUNTER — Other Ambulatory Visit: Payer: Self-pay

## 2016-12-06 ENCOUNTER — Ambulatory Visit: Payer: BLUE CROSS/BLUE SHIELD | Attending: Nurse Practitioner | Admitting: Nurse Practitioner

## 2016-12-06 ENCOUNTER — Encounter: Payer: Self-pay | Admitting: Nurse Practitioner

## 2016-12-06 VITALS — BP 153/98 | HR 110 | Temp 98.7°F | Resp 16 | Ht 68.0 in | Wt 178.0 lb

## 2016-12-06 DIAGNOSIS — K219 Gastro-esophageal reflux disease without esophagitis: Secondary | ICD-10-CM | POA: Diagnosis not present

## 2016-12-06 DIAGNOSIS — N451 Epididymitis: Secondary | ICD-10-CM | POA: Insufficient documentation

## 2016-12-06 DIAGNOSIS — M5441 Lumbago with sciatica, right side: Secondary | ICD-10-CM | POA: Diagnosis not present

## 2016-12-06 DIAGNOSIS — L723 Sebaceous cyst: Secondary | ICD-10-CM | POA: Diagnosis not present

## 2016-12-06 DIAGNOSIS — Z87442 Personal history of urinary calculi: Secondary | ICD-10-CM | POA: Insufficient documentation

## 2016-12-06 DIAGNOSIS — Z79891 Long term (current) use of opiate analgesic: Secondary | ICD-10-CM | POA: Insufficient documentation

## 2016-12-06 DIAGNOSIS — E538 Deficiency of other specified B group vitamins: Secondary | ICD-10-CM | POA: Insufficient documentation

## 2016-12-06 DIAGNOSIS — N189 Chronic kidney disease, unspecified: Secondary | ICD-10-CM | POA: Insufficient documentation

## 2016-12-06 DIAGNOSIS — M62838 Other muscle spasm: Secondary | ICD-10-CM | POA: Diagnosis not present

## 2016-12-06 DIAGNOSIS — M5442 Lumbago with sciatica, left side: Secondary | ICD-10-CM

## 2016-12-06 DIAGNOSIS — M899 Disorder of bone, unspecified: Secondary | ICD-10-CM | POA: Diagnosis not present

## 2016-12-06 DIAGNOSIS — M79604 Pain in right leg: Secondary | ICD-10-CM

## 2016-12-06 DIAGNOSIS — M545 Low back pain: Secondary | ICD-10-CM | POA: Insufficient documentation

## 2016-12-06 DIAGNOSIS — J309 Allergic rhinitis, unspecified: Secondary | ICD-10-CM | POA: Diagnosis not present

## 2016-12-06 DIAGNOSIS — G8929 Other chronic pain: Secondary | ICD-10-CM | POA: Diagnosis not present

## 2016-12-06 DIAGNOSIS — E785 Hyperlipidemia, unspecified: Secondary | ICD-10-CM | POA: Insufficient documentation

## 2016-12-06 DIAGNOSIS — R202 Paresthesia of skin: Secondary | ICD-10-CM | POA: Insufficient documentation

## 2016-12-06 DIAGNOSIS — M40202 Unspecified kyphosis, cervical region: Secondary | ICD-10-CM | POA: Insufficient documentation

## 2016-12-06 DIAGNOSIS — G894 Chronic pain syndrome: Secondary | ICD-10-CM | POA: Insufficient documentation

## 2016-12-06 DIAGNOSIS — M79605 Pain in left leg: Secondary | ICD-10-CM | POA: Diagnosis not present

## 2016-12-06 DIAGNOSIS — Z789 Other specified health status: Secondary | ICD-10-CM

## 2016-12-06 DIAGNOSIS — Z79899 Other long term (current) drug therapy: Secondary | ICD-10-CM | POA: Diagnosis not present

## 2016-12-06 NOTE — Progress Notes (Signed)
Safety precautions to be maintained throughout the outpatient stay will include: orient to surroundings, keep bed in low position, maintain call bell within reach at all times, provide assistance with transfer out of bed and ambulation.  

## 2016-12-06 NOTE — Patient Instructions (Signed)

## 2016-12-06 NOTE — Progress Notes (Signed)
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 DG Lumbar Spine Complete W/Bend  . Compliance Drug Analysis, Ur  . Comp. Metabolic Panel (12)  . C-reactive protein  . Sedimentation rate  . Magnesium  . Vitamin B12  . 25-Hydroxyvitamin D Lcms D2+D3  . Ambulatory referral to Psychology   Pharmacotherapy: Medications ordered:  No orders of the defined types were placed in this encounter.  Medications administered during this visit: Friend Dorfman. Nordgren had no medications administered during this visit.   Pharmacotherapy under consideration:  Opioid Analgesics: The patient was informed that there is no guarantee that he would be a candidate for opioid analgesics. The decision will be made following CDC guidelines. This decision will be based on the results of diagnostic studies, as well as Mr. Longton's risk profile.  According to referral information. Pt no longer has a PCP and was a self referral.  Membrane stabilizer: To be determined at a later time Muscle relaxant:  To be determined at a later time NSAID: To be determined at a later time Other analgesic(s): To be determined at a later time   Interventional therapies under consideration: Mr. Mally was informed that there is no guarantee that he would be a candidate for interventional therapies. The decision will be based on the results of diagnostic studies, as well as Mr. Diggs's risk profile.  Possible procedure(s): Diagnostic lumbar ESI Diagnostic lumbar facet nerve block Possible lumbar facet RFA    Provider-requested follow-up: Return for 2nd Visit, w/ Dr. Dossie Arbour, after [facility] eval.  No future appointments.  Primary Care Physician: Redmond School, MD Location: [facility] Note by:  Date: 12/06/2016; Time: 9:57 AM  Pain Score Disclaimer: We use the NRS-11 scale. This is  a self-reported, subjective measurement of pain severity with only modest accuracy. It is used primarily to identify changes within a particular patient. It must be understood that outpatient pain scales are significantly less accurate that those used for research, where they can be applied under ideal controlled circumstances with minimal exposure to variables. In reality, the score is likely to be a combination of pain intensity and pain affect, where pain affect describes the degree of emotional arousal or changes in action readiness caused by the sensory experience of pain. Factors such as social and work situation, setting, emotional state, anxiety levels, expectation, and prior pain experience may influence pain perception and show large inter-individual differences that may also be affected by time variables.  Patient instructions provided during this appointment: Patient Instructions    ____________________________________________________________________________________________  Appointment Policy Summary  It is our goal and responsibility to provide the medical community with  assistance in the evaluation and management of patients with chronic pain. Unfortunately our resources are limited. Because we do not have an unlimited amount of time, or available appointments, we are required to closely monitor and manage their use. The following rules exist to maximize their use:  Patient's responsibilities: 1. Punctuality:  At what time should I arrive? You should be physically present in our office 30 minutes before your scheduled appointment. Your scheduled appointment is with your assigned healthcare provider. However, it takes 5-10 minutes to be "checked-in", and another 15 minutes for the nurses to do the admission. If you arrive to our office at the time you were given for your appointment, you will end up being at least 20-25 minutes late to your appointment with the provider. 2. Tardiness:  What happens if I arrive only a few minutes after my scheduled appointment time? You will need to reschedule your appointment. The cutoff is your appointment time. This is why it is so important that you arrive at least 30 minutes before that appointment. If you have an appointment scheduled for 10:00 AM and you arrive at 10:01, you will be required to reschedule your appointment.  3. Plan ahead:  Always assume that you will encounter traffic on your way in. Plan for it. If you are dependent on a driver, make sure they understand these rules and the need to arrive early. 4. Other appointments and responsibilities:  Avoid scheduling any other appointments before or after your pain clinic appointments.  5. Be prepared:  Write down everything that you need to discuss with your healthcare provider and give this information to the admitting nurse. Write down the medications that you will need refilled. Bring your pills and bottles (even the empty ones), to all of your appointments, except for those where a procedure is scheduled. 6. No children or pets:  Find someone to take care of them. It  is not appropriate to bring them in. 7. Scheduling changes:  We request "advanced notification" of any changes or cancellations. 8. Advanced notification:  Defined as a time period of more than 24 hours prior to the originally scheduled appointment. This allows for the appointment to be offered to other patients. 9. Rescheduling:  When a visit is rescheduled, it will require the cancellation of the original appointment. For this reason they both fall within the category of "Cancellations".  10. Cancellations:  They require advanced notification. Any cancellation less than 24 hours before the  appointment will be recorded as a "No Show". 11. No Show:  Defined as an unkept appointment where the patient failed  to notify or declare to the practice their intention or inability to keep the appointment.  Corrective process for repeat offenders:  1. Tardiness: Three (3) episodes of rescheduling due to late arrivals will be recorded as one (1) "No Show". 2. Cancellation or reschedule: Three (3) cancellations or rescheduling will be recorded as one (1) "No Show". 3. "No Shows": Three (3) "No Shows" within a 12 month period will result in discharge from the practice.  ____________________________________________________________________________________________   ____________________________________________________________________________________________  Pain Scale  Introduction: The pain score used by this practice is the Verbal Numerical Rating Scale (VNRS-11). This is an 11-point scale. It is for adults and children 10 years or older. There are significant differences in how the pain score is reported, used, and applied. Forget everything you learned in the past and learn this scoring system.  General Information: The scale should reflect your current level of pain. Unless you are specifically asked for the level of your worst pain, or your average pain. If you are asked for one of these two, then  it should be understood that it is over the past 24 hours.  Basic Activities of Daily Living (ADL): Personal hygiene, dressing, eating, transferring, and using restroom.  Instructions: Most patients tend to report their level of pain as a combination of two factors, their physical pain and their psychosocial pain. This last one is also known as "suffering" and it is reflection of how physical pain affects you socially and psychologically. From now on, report them separately. From this point on, when asked to report your pain level, report only your physical pain. Use the following table for reference.  Pain Clinic Pain Levels (0-5/10)  Pain Level Score  Description  No Pain 0   Mild pain 1 Nagging, annoying, but does not interfere with basic activities of daily living (ADL). Patients are able to eat, bathe, get dressed, toileting (being able to get on and off the toilet and perform personal hygiene functions), transfer (move in and out of bed or a chair without assistance), and maintain continence (able to control bladder and bowel functions). Blood pressure and heart rate are unaffected. A normal heart rate for a healthy adult ranges from 60 to 100 bpm (beats per minute).   Mild to moderate pain 2 Noticeable and distracting. Impossible to hide from other people. More frequent flare-ups. Still possible to adapt and function close to normal. It can be very annoying and may have occasional stronger flare-ups. With discipline, patients may get used to it and adapt.   Moderate pain 3 Interferes significantly with activities of daily living (ADL). It becomes difficult to feed, bathe, get dressed, get on and off the toilet or to perform personal hygiene functions. Difficult to get in and out of bed or a chair without assistance. Very distracting. With effort, it can be ignored when deeply involved in activities.   Moderately severe pain 4 Impossible to ignore for more than a few minutes. With effort,  patients may still be able to manage work or participate in some social activities. Very difficult to concentrate. Signs of autonomic nervous system discharge are evident: dilated pupils (mydriasis); mild sweating (diaphoresis); sleep interference. Heart rate becomes elevated (>115 bpm). Diastolic blood pressure (lower number) rises above 100 mmHg. Patients find relief in laying down and not moving.   Severe pain 5 Intense and extremely unpleasant. Associated with frowning face and frequent crying. Pain overwhelms the senses.  Ability to do any activity or maintain social relationships becomes significantly  limited. Conversation becomes difficult. Pacing back and forth is common, as getting into a comfortable position is nearly impossible. Pain wakes you up from deep sleep. Physical signs will be obvious: pupillary dilation; increased sweating; goosebumps; brisk reflexes; cold, clammy hands and feet; nausea, vomiting or dry heaves; loss of appetite; significant sleep disturbance with inability to fall asleep or to remain asleep. When persistent, significant weight loss is observed due to the complete loss of appetite and sleep deprivation.  Blood pressure and heart rate becomes significantly elevated. Caution: If elevated blood pressure triggers a pounding headache associated with blurred vision, then the patient should immediately seek attention at an urgent or emergency care unit, as these may be signs of an impending stroke.    Emergency Department Pain Levels (6-10/10)  Emergency Room Pain 6 Severely limiting. Requires emergency care and should not be seen or managed at an outpatient pain management facility. Communication becomes difficult and requires great effort. Assistance to reach the emergency department may be required. Facial flushing and profuse sweating along with potentially dangerous increases in heart rate and blood pressure will be evident.   Distressing pain 7 Self-care is very  difficult. Assistance is required to transport, or use restroom. Assistance to reach the emergency department will be required. Tasks requiring coordination, such as bathing and getting dressed become very difficult.   Disabling pain 8 Self-care is no longer possible. At this level, pain is disabling. The individual is unable to do even the most "basic" activities such as walking, eating, bathing, dressing, transferring to a bed, or toileting. Fine motor skills are lost. It is difficult to think clearly.   Incapacitating pain 9 Pain becomes incapacitating. Thought processing is no longer possible. Difficult to remember your own name. Control of movement and coordination are lost.   The worst pain imaginable 10 At this level, most patients pass out from pain. When this level is reached, collapse of the autonomic nervous system occurs, leading to a sudden drop in blood pressure and heart rate. This in turn results in a temporary and dramatic drop in blood flow to the brain, leading to a loss of consciousness. Fainting is one of the body's self defense mechanisms. Passing out puts the brain in a calmed state and causes it to shut down for a while, in order to begin the healing process.    Summary: 1. Refer to this scale when providing [place] with your pain level. 2. Be accurate and careful when reporting your pain level. This will help with your care. 3. Over-reporting your pain level will lead to loss of credibility. 4. Even a level of 1/10 means that there is pain and will be treated at our facility. 5. High, inaccurate reporting will be documented as "Symptom Exaggeration", leading to loss of credibility and suspicions of possible secondary gains such as obtaining more narcotics, or wanting to appear disabled, for fraudulent reasons. 6. Only pain levels of 5 or below will be seen at our facility. 7. Pain levels of 6 and above will be sent to the Emergency Department and the appointment  cancelled. ____________________________________________________________________________________________

## 2016-12-11 LAB — COMPLIANCE DRUG ANALYSIS, UR

## 2016-12-12 ENCOUNTER — Ambulatory Visit: Payer: BLUE CROSS/BLUE SHIELD | Admitting: Pain Medicine

## 2016-12-13 LAB — VITAMIN B12: VITAMIN B 12: 748 pg/mL (ref 232–1245)

## 2016-12-13 LAB — C-REACTIVE PROTEIN: CRP: 1.3 mg/L (ref 0.0–4.9)

## 2016-12-13 LAB — COMP. METABOLIC PANEL (12)
A/G RATIO: 2.2 (ref 1.2–2.2)
AST: 40 IU/L (ref 0–40)
Albumin: 4.6 g/dL (ref 3.5–5.5)
Alkaline Phosphatase: 53 IU/L (ref 39–117)
BILIRUBIN TOTAL: 0.3 mg/dL (ref 0.0–1.2)
BUN / CREAT RATIO: 7 — AB (ref 9–20)
BUN: 6 mg/dL (ref 6–24)
CALCIUM: 9.4 mg/dL (ref 8.7–10.2)
CHLORIDE: 101 mmol/L (ref 96–106)
Creatinine, Ser: 0.87 mg/dL (ref 0.76–1.27)
GFR calc non Af Amer: 108 mL/min/{1.73_m2} (ref >59)
GFR, EST AFRICAN AMERICAN: 125 mL/min/{1.73_m2} (ref >59)
GLOBULIN, TOTAL: 2.1 g/dL (ref 1.5–4.5)
Glucose: 102 mg/dL — ABNORMAL HIGH (ref 65–99)
POTASSIUM: 4.6 mmol/L (ref 3.5–5.2)
SODIUM: 138 mmol/L (ref 134–144)
TOTAL PROTEIN: 6.7 g/dL (ref 6.0–8.5)

## 2016-12-13 LAB — MAGNESIUM: Magnesium: 1.8 mg/dL (ref 1.6–2.3)

## 2016-12-13 LAB — SEDIMENTATION RATE: Sed Rate: 3 mm/hr (ref 0–15)

## 2016-12-13 LAB — 25-HYDROXY VITAMIN D LCMS D2+D3
25-Hydroxy, Vitamin D-2: <1 ng/mL
25-Hydroxy, Vitamin D: 39 ng/mL

## 2016-12-13 LAB — 25-HYDROXYVITAMIN D LCMS D2+D3: 25-HYDROXY, VITAMIN D-3: 39 ng/mL

## 2016-12-18 ENCOUNTER — Encounter: Payer: Self-pay | Admitting: Pain Medicine

## 2016-12-18 NOTE — Progress Notes (Deleted)
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 (Neurological): Unimpaired Palpation: No palpable anomalies       Provocative Tests: Lumbar Hyperextension and rotation test: evaluation deferred today       Lumbar Lateral bending test: evaluation deferred today       Patrick's Maneuver: evaluation deferred today                    Gait & Posture Assessment  Ambulation: Unassisted Gait: Relatively normal for age and body habitus Posture: WNL   Lower Extremity Exam    Side: Right lower extremity  Side: Left lower extremity  Skin & Extremity Inspection: Skin color, temperature, and hair growth are WNL. No peripheral edema or cyanosis. No masses, redness, swelling, asymmetry, or associated skin lesions. No contractures.  Skin & Extremity Inspection: Skin color, temperature, and hair growth are WNL. No peripheral edema or cyanosis. No masses, redness, swelling, asymmetry, or associated skin lesions. No  contractures.  Functional ROM: Unrestricted ROM          Functional ROM: Unrestricted ROM          Muscle Tone/Strength: Functionally intact. No obvious neuro-muscular anomalies detected.  Muscle Tone/Strength: Functionally intact. No obvious neuro-muscular anomalies detected.  Sensory (Neurological): Unimpaired  Sensory (Neurological): Unimpaired  Palpation: No palpable anomalies  Palpation: No palpable anomalies   Assessment & Plan  Primary Diagnosis & Pertinent Problem List: The primary encounter diagnosis was Chronic pain syndrome. Diagnoses of Pharmacologic therapy, Problems influencing health status, Opiate use, Chronic low back pain (Primary Area of Pain) (Bilateral) (L>R), and Chronic lower extremity pain (Secondary Area of Pain) (Bilateral) (L>R) were also pertinent to this visit.  Visit Diagnosis: 1. Chronic pain syndrome   2. Pharmacologic therapy   3. Problems influencing health status   4. Opiate use   5. Chronic low back pain (Primary Area of Pain) (Bilateral) (L>R)   6. Chronic lower extremity pain (Secondary Area of Pain) (Bilateral) (L>R)  Problems updated and reviewed during this visit: Problem  Chronic low back pain (Primary Area of Pain) (Bilateral) (L>R)  Chronic lower extremity pain (Secondary Area of Pain) (Bilateral) (L>R)  Pharmacologic Therapy  Problems Influencing Health Status  Opiate Use  Long Term Current Use of Opiate Analgesic  Disorder of Skeletal System  Attention Deficit Disorder (Add) in Adult   Qualifier: Diagnosis of  By: Sherren Mocha MD, Jory Ee      Plan of Care  Pharmacotherapy (Medications Ordered): No orders of the defined types were placed in this encounter.  Procedure Orders    No procedure(s) ordered today   Lab Orders  No laboratory test(s) ordered today   Imaging Orders  No imaging studies ordered today   Referral Orders  No referral(s) requested today    Pharmacological management options:  Opioid Analgesics: We'll take  over management today. See above orders Membrane stabilizer: We have discussed the possibility of optimizing this mode of therapy, if tolerated Muscle relaxant: We have discussed the possibility of a trial NSAID: We have discussed the possibility of a trial Other analgesic(s): To be determined at a later time   Interventional management options: Planned, scheduled, and/or pending:    ***   Considering:   Diagnostic lumbar ESI Diagnostic lumbar facet nerve block Possible lumbar facet RFA    PRN Procedures:   None at this time   Provider-requested follow-up: No Follow-up on file.  Future Appointments  Date Time Provider [place]  12/19/2016  9:45 AM Milinda Pointer, MD [facility] None    Primary Care Physician: Redmond School, MD Location: [facility] Note by: Gaspar Cola, MD Date: 12/19/2016; Time: 7:52 AM

## 2016-12-19 ENCOUNTER — Ambulatory Visit: Payer: BLUE CROSS/BLUE SHIELD | Admitting: Pain Medicine

## 2016-12-19 DIAGNOSIS — Z789 Other specified health status: Secondary | ICD-10-CM | POA: Insufficient documentation

## 2016-12-19 DIAGNOSIS — Z79899 Other long term (current) drug therapy: Secondary | ICD-10-CM | POA: Insufficient documentation

## 2016-12-19 DIAGNOSIS — F1190 Opioid use, unspecified, uncomplicated: Secondary | ICD-10-CM | POA: Insufficient documentation

## 2016-12-27 NOTE — Progress Notes (Addendum)
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 Perkins  has a past surgical history that includes Tonsillectomy. Family: family history includes Cancer in his father; Multiple sclerosis in his mother.  Constitutional Exam  General appearance: Well nourished, well developed, and well hydrated. In no apparent acute distress Vitals:   12/28/16 0906  BP: 137/87  Pulse: (!) 108  Temp: 98.4 F (36.9 C)  SpO2: 100%  Weight: 178 lb (80.7 kg)  Height: '5\' 8"'  (1.727 m)   BMI Assessment: Estimated body mass index is 27.06 kg/m as calculated from the following:   Height as of this encounter: '5\' 8"'  (1.727 m).   Weight as of this encounter: 178 lb (80.7 kg).  BMI interpretation table: BMI level Category Range association with higher incidence of chronic pain  <18 kg/m2 Underweight   18.5-24.9 kg/m2 Ideal body weight   25-29.9 kg/m2  Overweight Increased incidence by 20%  30-34.9 kg/m2 Obese (Class I) Increased incidence by 68%  35-39.9 kg/m2 Severe obesity (Class II) Increased incidence by 136%  >40 kg/m2 Extreme obesity (Class III) Increased incidence by 254%   BMI Readings from Last 4 Encounters:  12/28/16 27.06 kg/m  12/06/16 27.06 kg/m  11/15/16 27.06 kg/m  11/15/16 27.06 kg/m   Wt Readings from Last 4 Encounters:  12/28/16 178 lb (80.7 kg)  12/06/16 178 lb (80.7 kg)  11/15/16 178 lb (80.7 kg)  11/15/16 178 lb (80.7 kg)  Psych/Mental status: Alert, oriented x 3 (person, place, & time)       Eyes: PERLA Respiratory: No evidence of acute respiratory distress  Cervical Spine Area Exam  Skin & Axial Inspection: No masses, redness, edema, swelling, or associated skin lesions Alignment: Symmetrical Functional ROM: Unrestricted ROM      Stability: No instability detected Muscle Tone/Strength: Functionally intact. No obvious neuro-muscular anomalies detected. Sensory (Neurological): Unimpaired Palpation: No palpable anomalies              Upper Extremity (UE) Exam    Side: Right upper extremity  Side: Left upper extremity  Skin & Extremity Inspection: Skin color, temperature, and hair growth are WNL. No peripheral edema or cyanosis. No masses, redness, swelling, asymmetry, or associated skin lesions. No contractures.  Skin & Extremity Inspection: Skin color, temperature, and hair growth are WNL. No peripheral edema or cyanosis. No masses, redness, swelling, asymmetry, or associated skin lesions. No contractures.  Functional ROM: Unrestricted ROM          Functional ROM: Unrestricted ROM          Muscle Tone/Strength: Functionally intact. No obvious neuro-muscular anomalies detected.  Muscle Tone/Strength: Functionally intact. No obvious neuro-muscular anomalies detected.  Sensory (Neurological): Unimpaired          Sensory (Neurological): Unimpaired          Palpation: No palpable anomalies               Palpation: No palpable anomalies              Specialized Test(s): Deferred         Specialized Test(s): Deferred          Thoracic Spine Area Exam  Skin & Axial Inspection: No masses, redness, or swelling Alignment: Symmetrical Functional ROM: Unrestricted ROM Stability: No instability detected Muscle Tone/Strength: Functionally intact. No obvious neuro-muscular anomalies detected. Sensory (Neurological): Unimpaired Muscle strength & Tone: No palpable anomalies  Lumbar Spine Area Exam  Skin & Axial Inspection: No masses, redness, or swelling Alignment: Symmetrical Functional ROM: Decreased ROM      Stability: No instability detected Muscle Tone/Strength: Functionally intact. No obvious neuro-muscular anomalies detected. Sensory (Neurological): Movement-associated pain Palpation: Complains of area being tender to palpation       Provocative Tests: Lumbar Hyperextension and rotation test: Positive bilaterally for facet joint pain. Lumbar Lateral bending test: evaluation deferred today       Patrick's Maneuver: Positive for bilateral S-I arthralgia and for bilateral hip arthralgia  Gait & Posture Assessment  Ambulation: Unassisted Gait: Relatively normal for age and body habitus Posture: WNL   Lower Extremity Exam    Side: Right lower extremity  Side: Left lower extremity  Skin & Extremity Inspection: Skin color, temperature, and hair growth are WNL. No peripheral edema or cyanosis. No masses, redness, swelling, asymmetry, or associated skin lesions. No contractures.  Skin & Extremity Inspection: Skin color, temperature, and hair growth are WNL. No peripheral edema or cyanosis. No masses, redness, swelling, asymmetry, or associated skin lesions. No contractures.  Functional ROM: Unrestricted ROM          Functional ROM: Unrestricted ROM          Muscle Tone/Strength: Able to Toe-walk & Heel-walk without problems  Muscle Tone/Strength: Able to Toe-walk & Heel-walk without problems   Sensory (Neurological): Referred pain pattern  Sensory (Neurological): Dermatomal pain pattern following an S1 dermatomal distribution to the bottom of the foot.   Palpation: No palpable anomalies  Palpation: No palpable anomalies   Assessment & Plan  Primary Diagnosis & Pertinent Problem List: The primary encounter diagnosis was Chronic low back pain (Primary Area of Pain) (Bilateral) (L>R). Diagnoses of Lumbar facet syndrome (Bilateral) (L>R), Chronic sacroiliac joint pain (Bilateral) (L>R), Chronic lower extremity pain (Secondary Area of Pain) (Bilateral) (L>R), Chronic lumbar radicular pain (S1) (Left), Chronic hip pain (Bilateral) (L>R), Chronic hand pain (Right), Long term current use of opiate analgesic, and Opiate use were also pertinent to this visit.  Visit Diagnosis: 1. Chronic low back pain (Primary Area of Pain) (Bilateral) (L>R)   2. Lumbar facet syndrome (Bilateral) (L>R)   3. Chronic sacroiliac joint pain (Bilateral) (L>R)   4. Chronic lower extremity pain (Secondary Area of Pain) (Bilateral) (L>R)   5. Chronic lumbar radicular  pain (S1) (Left)   6. Chronic hip pain (Bilateral) (L>R)   7. Chronic hand pain (Right)   8. Long term current use of opiate analgesic   9. Opiate use    Problems updated and reviewed during this visit: Problem  Chronic sacroiliac joint pain (Bilateral) (L>R)  Lumbar facet syndrome (Bilateral) (L>R)  Chronic lumbar radicular pain (S1) (Left)  Chronic hip pain (Bilateral) (L>R)  Chronic hand pain (Right)  Chronic lower extremity pain (Secondary Area of Pain) (Bilateral) (L>R)   RLEP: Pain to buttocks, occasionally to the back of the knee. LLEP: Down to foot (Bottom - S1)    .Time Note: Greater than 50% of the 40 minute(s) of face-to-face time spent with Mr. Soberano, was spent in counseling/coordination of care regarding: the appropriate use of the pain scale, Mr. Evetts's primary cause of pain, the results of his recent test(s), the significance  of each one oth the test(s) anomalies and it's corresponding characteristic pain pattern(s), the treatment plan, treatment alternatives, the risks and possible complications of proposed treatment, medication side effects, the opioid analgesic risks and possible complications and the need to collect and read the AVS material.  Plan of Care  Pharmacotherapy (Medications Ordered): No orders of the defined types were placed in this encounter.   Procedure Orders     LUMBAR FACET(MEDIAL BRANCH NERVE BLOCK) MBNB     SACROILIAC JOINT INJECTION Lab Orders  No laboratory test(s) ordered today    Imaging Orders     DG Lumbar Spine Complete W/Bend     DG Si Joints     DG HIP UNILAT W OR W/O PELVIS 2-3 VIEWS RIGHT     DG HIP UNILAT W OR W/O PELVIS 2-3 VIEWS LEFT     DG Hand Complete Right Referral Orders  No referral(s) requested today    Pharmacological management options:  Opioid Analgesics: Not indicated at this time Membrane stabilizer: None prescribed at this time Muscle relaxant: None prescribed at this time NSAID: None prescribed at this time Other analgesic(s): None prescribed at this time   Interventional management options: Planned, scheduled, and/or pending:    None procedures at this time.  Obtain the results of his prior MRIs  EMG/PNCV of the lower extremities to evaluate his apparent left lower extremity radiculitis.  X-rays of the lumbar spine, hips, and sacroiliac joint, as well as right hand.    Considering:   Diagnostic bilateral lumbar facet block  Possible bilateral lumbar facet RFA  Diagnostic bilateral sacroiliac joint block  Possible bilateral sacroiliac joint RFA  Diagnostic left sided L5-S1 lumbar epidural steroid injection  Diagnostic bilateral intra-articular hip joint injection  Diagnostic bilateral femoral nerve block + obturator nerve block  Possible bilateral femoral nerve + obturator nerve RFA    PRN Procedures:   None at this time    Provider-requested follow-up: Return for F/U eval w/ Dr. Dossie Arbour after test completion.  No future appointments.  Primary Care Physician: Redmond School, MD Location: [facility] Note by: Gaspar Cola, MD Date: 12/28/2016; Time: 10:41 AM

## 2016-12-28 ENCOUNTER — Ambulatory Visit
Admission: RE | Admit: 2016-12-28 | Discharge: 2016-12-28 | Disposition: A | Discharge status: Home / Self Care | Payer: BLUE CROSS/BLUE SHIELD | Source: Ambulatory Visit | Attending: Pain Medicine | Admitting: Pain Medicine

## 2016-12-28 ENCOUNTER — Other Ambulatory Visit: Payer: Self-pay

## 2016-12-28 ENCOUNTER — Encounter: Payer: Self-pay | Admitting: Pain Medicine

## 2016-12-28 ENCOUNTER — Ambulatory Visit: Payer: BLUE CROSS/BLUE SHIELD | Attending: Pain Medicine | Admitting: Pain Medicine

## 2016-12-28 ENCOUNTER — Ambulatory Visit
Admission: RE | Admit: 2016-12-28 | Discharge: 2016-12-28 | Disposition: A | Discharge status: Home / Self Care | Payer: BLUE CROSS/BLUE SHIELD | Source: Ambulatory Visit | Attending: Nurse Practitioner | Admitting: Nurse Practitioner

## 2016-12-28 VITALS — BP 137/87 | HR 108 | Temp 98.4°F | Ht 68.0 in | Wt 178.0 lb

## 2016-12-28 DIAGNOSIS — M25552 Pain in left hip: Secondary | ICD-10-CM

## 2016-12-28 DIAGNOSIS — M5442 Lumbago with sciatica, left side: Secondary | ICD-10-CM | POA: Insufficient documentation

## 2016-12-28 DIAGNOSIS — M79605 Pain in left leg: Secondary | ICD-10-CM | POA: Diagnosis not present

## 2016-12-28 DIAGNOSIS — J309 Allergic rhinitis, unspecified: Secondary | ICD-10-CM | POA: Insufficient documentation

## 2016-12-28 DIAGNOSIS — E538 Deficiency of other specified B group vitamins: Secondary | ICD-10-CM | POA: Insufficient documentation

## 2016-12-28 DIAGNOSIS — R202 Paresthesia of skin: Secondary | ICD-10-CM | POA: Insufficient documentation

## 2016-12-28 DIAGNOSIS — M79604 Pain in right leg: Secondary | ICD-10-CM | POA: Insufficient documentation

## 2016-12-28 DIAGNOSIS — M25551 Pain in right hip: Secondary | ICD-10-CM

## 2016-12-28 DIAGNOSIS — M5441 Lumbago with sciatica, right side: Secondary | ICD-10-CM

## 2016-12-28 DIAGNOSIS — F988 Other specified behavioral and emotional disorders with onset usually occurring in childhood and adolescence: Secondary | ICD-10-CM | POA: Diagnosis not present

## 2016-12-28 DIAGNOSIS — M533 Sacrococcygeal disorders, not elsewhere classified: Secondary | ICD-10-CM | POA: Insufficient documentation

## 2016-12-28 DIAGNOSIS — K219 Gastro-esophageal reflux disease without esophagitis: Secondary | ICD-10-CM | POA: Insufficient documentation

## 2016-12-28 DIAGNOSIS — Z79891 Long term (current) use of opiate analgesic: Secondary | ICD-10-CM | POA: Insufficient documentation

## 2016-12-28 DIAGNOSIS — E785 Hyperlipidemia, unspecified: Secondary | ICD-10-CM | POA: Insufficient documentation

## 2016-12-28 DIAGNOSIS — M79641 Pain in right hand: Secondary | ICD-10-CM | POA: Insufficient documentation

## 2016-12-28 DIAGNOSIS — M47816 Spondylosis without myelopathy or radiculopathy, lumbar region: Secondary | ICD-10-CM | POA: Diagnosis not present

## 2016-12-28 DIAGNOSIS — F1190 Opioid use, unspecified, uncomplicated: Secondary | ICD-10-CM | POA: Diagnosis not present

## 2016-12-28 DIAGNOSIS — Z880 Allergy status to penicillin: Secondary | ICD-10-CM | POA: Insufficient documentation

## 2016-12-28 DIAGNOSIS — M5416 Radiculopathy, lumbar region: Secondary | ICD-10-CM | POA: Diagnosis not present

## 2016-12-28 DIAGNOSIS — G8929 Other chronic pain: Secondary | ICD-10-CM

## 2016-12-28 DIAGNOSIS — M545 Low back pain: Secondary | ICD-10-CM | POA: Diagnosis not present

## 2016-12-28 DIAGNOSIS — M5418 Radiculopathy, sacral and sacrococcygeal region: Secondary | ICD-10-CM | POA: Diagnosis not present

## 2016-12-28 DIAGNOSIS — Z79899 Other long term (current) drug therapy: Secondary | ICD-10-CM | POA: Insufficient documentation

## 2016-12-28 DIAGNOSIS — G894 Chronic pain syndrome: Secondary | ICD-10-CM | POA: Diagnosis not present

## 2016-12-28 NOTE — Patient Instructions (Addendum)
_________________you have been instructed to get xrays done and a nerve conduction test.  Pre procedure instructions givenPreparing for Procedure with Sedation Instructions: . Oral Intake: Do not eat or drink anything for at least 8 hours prior to your procedure. . Transportation: Public transportation is not allowed. Bring an adult driver. The driver must be physically present in our waiting room before any procedure can be started. Marland Kitchen. Physical Assistance: Bring an adult capable of physically assisting you, in the event you need help. . Blood Pressure Medicine: Take your blood pressure medicine with a sip of water the morning of the procedure. . Insulin: Take only  of your normal insulin dose. . Preventing infections: Shower with an antibacterial soap the morning of your procedure. . Build-up your immune system: Take 1000 mg of Vitamin C with every meal (3 times a day) the day prior to your procedure. . Pregnancy: If you are pregnant, call and cancel the procedure. . Sickness: If you have a cold, fever, or any active infections, call and cancel the procedure. . Arrival: You must be in the facility at least 30 minutes prior to your scheduled procedure. . Children: Do not bring children with you. . Dress appropriately: Bring dark clothing that you would not mind if they get stained. . Valuables: Do not bring any jewelry or valuables. Procedure appointments are reserved for interventional treatments only. Marland Kitchen. No Prescription Refills. . No medication changes will be discussed during procedure appointments. No disability issues will be discussed.Sacroiliac (SI) Joint Injection Patient Information  Description: The sacroiliac joint connects the scrum (very low back and tailbone) to the ilium (a pelvic bone which also forms half of the hip joint).  Normally this joint experiences very little motion.  When this joint becomes inflamed or unstable low back and or hip and pelvis pain may result.  Injection  of this joint with local anesthetics (numbing medicines) and steroids can provide diagnostic information and reduce pain.  This injection is performed with the aid of x-ray guidance into the tailbone area while you are lying on your stomach.   You may experience an electrical sensation down the leg while this is being done.  You may also experience numbness.  We also may ask if we are reproducing your normal pain during the injection.  Conditions which may be treated SI injection:   Low back, buttock, hip or leg pain  Preparation for the Injection:  1. Do not eat any solid food or dairy products within 8 hours of your appointment.  2. You may drink clear liquids up to 3 hours before appointment.  Clear liquids include water, black coffee, juice or soda.  No milk or cream please. 3. You may take your regular medications, including pain medications with a sip of water before your appointment.  Diabetics should hold regular insulin (if take separately) and take 1/2 normal NPH dose the morning of the procedure.  Carry some sugar containing items with you to your appointment. 4. A driver must accompany you and be prepared to drive you home after your procedure. 5. Bring all of your current medications with you. 6. An IV may be inserted and sedation may be given at the discretion of the physician. 7. A blood pressure cuff, EKG and other monitors will often be applied during the procedure.  Some patients may need to have extra oxygen administered for a short period.  8. You will be asked to provide medical information, including your allergies, prior to the procedure.  We must know immediately if you are taking blood thinners (like Coumadin/Warfarin) or if you are allergic to IV iodine contrast (dye).  We must know if you could possible be pregnant.  Possible side effects:   Bleeding from needle site  Infection (rare, may require surgery)  Nerve injury (rare)  Numbness & tingling  (temporary)  A brief convulsion or seizure  Light-headedness (temporary)  Pain at injection site (several days)  Decreased blood pressure (temporary)  Weakness in the leg (temporary)   Call if you experience:   New onset weakness or numbness of an extremity below the injection site that last more than 8 hours.  Hives or difficulty breathing ( go to the emergency room)  Inflammation or drainage at the injection site  Any new symptoms which are concerning to you  Please note:  Although the local anesthetic injected can often make your back/ hip/ buttock/ leg feel good for several hours after the injections, the pain will likely return.  It takes 3-7 days for steroids to work in the sacroiliac area.  You may not notice any pain relief for at least that one week.  If effective, we will often do a series of three injections spaced 3-6 weeks apart to maximally decrease your pain.  After the initial series, we generally will wait some months before a repeat injection of the same type.  If you have any questions, please call [phone] [facility]  Facet Blocks Patient Information  Description: The facets are joints in the spine between the vertebrae.  Like any joints in the body, facets can become irritated and painful.  Arthritis can also effect the facets.  By injecting steroids and local anesthetic in and around these joints, we can temporarily block the nerve supply to them.  Steroids act directly on irritated nerves and tissues to reduce selling and inflammation which often leads to decreased pain.  Facet blocks may be done anywhere along the spine from the neck to the low back depending upon the location of your pain.   After numbing the skin with local anesthetic (like Novocaine), a small needle is passed onto the facet joints under x-ray guidance.  You may experience a sensation of pressure while this is being done.  The entire block usually  lasts about 15-25 minutes.   Conditions which may be treated by facet blocks:   Low back/buttock pain  Neck/shoulder pain  Certain types of headaches  Preparation for the injection:  10. Do not eat any solid food or dairy products within 8 hours of your appointment. 11. You may drink clear liquid up to 3 hours before appointment.  Clear liquids include water, black coffee, juice or soda.  No milk or cream please. 12. You may take your regular medication, including pain medications, with a sip of water before your appointment.  Diabetics should hold regular insulin (if taken separately) and take 1/2 normal NPH dose the morning of the procedure.  Carry some sugar containing items with you to your appointment. 13. A driver must accompany you and be prepared to drive you home after your procedure. 14. Bring all your current medications with you. 15. An IV may be inserted and sedation may be given at the discretion of the physician. 16. A blood pressure cuff, EKG and other monitors will often be applied during the procedure.  Some patients may need to have extra oxygen administered for a short period. 17. You will be asked to provide  medical information, including your allergies and medications, prior to the procedure.  We must know immediately if you are taking blood thinners (like Coumadin/Warfarin) or if you are allergic to IV iodine contrast (dye).  We must know if you could possible be pregnant.  Possible side-effects:   Bleeding from needle site  Infection (rare, may require surgery)  Nerve injury (rare)  Numbness & tingling (temporary)  Difficulty urinating (rare, temporary)  Spinal headache (a headache worse with upright posture)  Light-headedness (temporary)  Pain at injection site (serveral days)  Decreased blood pressure (rare, temporary)  Weakness in arm/leg (temporary)  Pressure sensation in back/neck (temporary)   Call if you experience:   Fever/chills  associated with headache or increased back/neck pain  Headache worsened by an upright position  New onset, weakness or numbness of an extremity below the injection site  Hives or difficulty breathing (go to the emergency room)  Inflammation or drainage at the injection site(s)  Severe back/neck pain greater than usual  New symptoms which are concerning to you  Please note:  Although the local anesthetic injected can often make your back or neck feel good for several hours after the injection, the pain will likely return. It takes 3-7 days for steroids to work.  You may not notice any pain relief for at least one week.  If effective, we will often do a series of 2-3 injections spaced 3-6 weeks apart to maximally decrease your pain.  After the initial series, you may be a candidate for a more permanent nerve block of the facets.  If you have any questions, please call [phone] [facility] ___________________________________________________________________________  Pain Scale  Introduction: The pain score used by this practice is the Verbal Numerical Rating Scale (VNRS-11). This is an 11-point scale. It is for adults and children 10 years or older. There are significant differences in how the pain score is reported, used, and applied. Forget everything you learned in the past and learn this scoring system.  General Information: The scale should reflect your current level of pain. Unless you are specifically asked for the level of your worst pain, or your average pain. If you are asked for one of these two, then it should be understood that it is over the past 24 hours.  Basic Activities of Daily Living (ADL): Personal hygiene, dressing, eating, transferring, and using restroom.  Instructions: Most patients tend to report their level of pain as a combination of two factors, their physical pain and their psychosocial pain. This last one is also known  as "suffering" and it is reflection of how physical pain affects you socially and psychologically. From now on, report them separately. From this point on, when asked to report your pain level, report only your physical pain. Use the following table for reference.  Pain Clinic Pain Levels (0-5/10)  Pain Level Score  Description  No Pain 0   Mild pain 1 Nagging, annoying, but does not interfere with basic activities of daily living (ADL). Patients are able to eat, bathe, get dressed, toileting (being able to get on and off the toilet and perform personal hygiene functions), transfer (move in and out of bed or a chair without assistance), and maintain continence (able to control bladder and bowel functions). Blood pressure and heart rate are unaffected. A normal heart rate for a healthy adult ranges from 60 to 100 bpm (beats per minute).   Mild to moderate pain 2 Noticeable and distracting. Impossible to  hide from other people. More frequent flare-ups. Still possible to adapt and function close to normal. It can be very annoying and may have occasional stronger flare-ups. With discipline, patients may get used to it and adapt.   Moderate pain 3 Interferes significantly with activities of daily living (ADL). It becomes difficult to feed, bathe, get dressed, get on and off the toilet or to perform personal hygiene functions. Difficult to get in and out of bed or a chair without assistance. Very distracting. With effort, it can be ignored when deeply involved in activities.   Moderately severe pain 4 Impossible to ignore for more than a few minutes. With effort, patients may still be able to manage work or participate in some social activities. Very difficult to concentrate. Signs of autonomic nervous system discharge are evident: dilated pupils (mydriasis); mild sweating (diaphoresis); sleep interference. Heart rate becomes elevated (>115 bpm). Diastolic blood pressure (lower number) rises above 100 mmHg.  Patients find relief in laying down and not moving.   Severe pain 5 Intense and extremely unpleasant. Associated with frowning face and frequent crying. Pain overwhelms the senses.  Ability to do any activity or maintain social relationships becomes significantly limited. Conversation becomes difficult. Pacing back and forth is common, as getting into a comfortable position is nearly impossible. Pain wakes you up from deep sleep. Physical signs will be obvious: pupillary dilation; increased sweating; goosebumps; brisk reflexes; cold, clammy hands and feet; nausea, vomiting or dry heaves; loss of appetite; significant sleep disturbance with inability to fall asleep or to remain asleep. When persistent, significant weight loss is observed due to the complete loss of appetite and sleep deprivation.  Blood pressure and heart rate becomes significantly elevated. Caution: If elevated blood pressure triggers a pounding headache associated with blurred vision, then the patient should immediately seek attention at an urgent or emergency care unit, as these may be signs of an impending stroke.    Emergency Department Pain Levels (6-10/10)  Emergency Room Pain 6 Severely limiting. Requires emergency care and should not be seen or managed at an outpatient pain management facility. Communication becomes difficult and requires great effort. Assistance to reach the emergency department may be required. Facial flushing and profuse sweating along with potentially dangerous increases in heart rate and blood pressure will be evident.   Distressing pain 7 Self-care is very difficult. Assistance is required to transport, or use restroom. Assistance to reach the emergency department will be required. Tasks requiring coordination, such as bathing and getting dressed become very difficult.   Disabling pain 8 Self-care is no longer possible. At this level, pain is disabling. The individual is unable to do even the most "basic"  activities such as walking, eating, bathing, dressing, transferring to a bed, or toileting. Fine motor skills are lost. It is difficult to think clearly.   Incapacitating pain 9 Pain becomes incapacitating. Thought processing is no longer possible. Difficult to remember your own name. Control of movement and coordination are lost.   The worst pain imaginable 10 At this level, most patients pass out from pain. When this level is reached, collapse of the autonomic nervous system occurs, leading to a sudden drop in blood pressure and heart rate. This in turn results in a temporary and dramatic drop in blood flow to the brain, leading to a loss of consciousness. Fainting is one of the body's self defense mechanisms. Passing out puts the brain in a calmed state and causes it to shut down for a while, in  order to begin the healing process.    Summary: 1. Refer to this scale when providing [place] with your pain level. 2. Be accurate and careful when reporting your pain level. This will help with your care. 3. Over-reporting your pain level will lead to loss of credibility. 4. Even a level of 1/10 means that there is pain and will be treated at our facility. 5. High, inaccurate reporting will be documented as "Symptom Exaggeration", leading to loss of credibility and suspicions of possible secondary gains such as obtaining more narcotics, or wanting to appear disabled, for fraudulent reasons. 6. Only pain levels of 5 or below will be seen at our facility. 7. Pain levels of 6 and above will be sent to the Emergency Department and the appointment cancelled. ____________________________________________________________________________________________   ____________________________________________________________________________________________  Preparing for Procedure with Sedation Instructions: . Oral Intake: Do not eat or drink anything for at least 8 hours prior to your procedure. . Transportation: Public  transportation is not allowed. Bring an adult driver. The driver must be physically present in our waiting room before any procedure can be started. Marland Kitchen Physical Assistance: Bring an adult physically capable of assisting you, in the event you need help. This adult should keep you company at home for at least 6 hours after the procedure. . Blood Pressure Medicine: Take your blood pressure medicine with a sip of water the morning of the procedure. . Blood thinners:  . Diabetics on insulin: Notify the staff so that you can be scheduled 1st case in the morning. If your diabetes requires high dose insulin, take only  of your normal insulin dose the morning of the procedure and notify the staff that you have done so. . Preventing infections: Shower with an antibacterial soap the morning of your procedure. . Build-up your immune system: Take 1000 mg of Vitamin C with every meal (3 times a day) the day prior to your procedure. Marland Kitchen Antibiotics: Inform the staff if you have a condition or reason that requires you to take antibiotics before dental procedures. . Pregnancy: If you are pregnant, call and cancel the procedure. . Sickness: If you have a cold, fever, or any active infections, call and cancel the procedure. . Arrival: You must be in the facility at least 30 minutes prior to your scheduled procedure. . Children: Do not bring children with you. . Dress appropriately: Bring dark clothing that you would not mind if they get stained. . Valuables: Do not bring any jewelry or valuables. Procedure appointments are reserved for interventional treatments only. Marland Kitchen No Prescription Refills. . No medication changes will be discussed during procedure appointments. . No disability issues will be discussed. ____________________________________________________________________________________________

## 2017-01-02 ENCOUNTER — Ambulatory Visit: Payer: BLUE CROSS/BLUE SHIELD | Admitting: Pain Medicine

## 2017-06-29 ENCOUNTER — Encounter: Payer: Self-pay | Admitting: Orthopedic Surgery

## 2017-10-10 ENCOUNTER — Ambulatory Visit: Payer: BLUE CROSS/BLUE SHIELD | Admitting: Orthopaedic Surgery

## 2017-10-10 ENCOUNTER — Encounter: Payer: Self-pay | Admitting: Orthopedic Surgery

## 2018-04-11 ENCOUNTER — Ambulatory Visit: Payer: BLUE CROSS/BLUE SHIELD | Admitting: Allergy & Immunology

## 2018-06-13 ENCOUNTER — Ambulatory Visit: Payer: Self-pay | Admitting: Allergy & Immunology

## 2018-07-04 ENCOUNTER — Ambulatory Visit: Payer: Self-pay | Admitting: Allergy & Immunology

## 2019-03-31 IMAGING — CR DG HAND COMPLETE 3+V*R*
1 series · 3 of 3 positions shown · non-contrast
Comparison: None.

CLINICAL DATA: Right hand pain

EXAM:
RIGHT HAND - COMPLETE 3+ VIEW

[Series 1: dg hand complete right · 0.14mm/px · 3 of 3 slices shown]
[im 1/3]
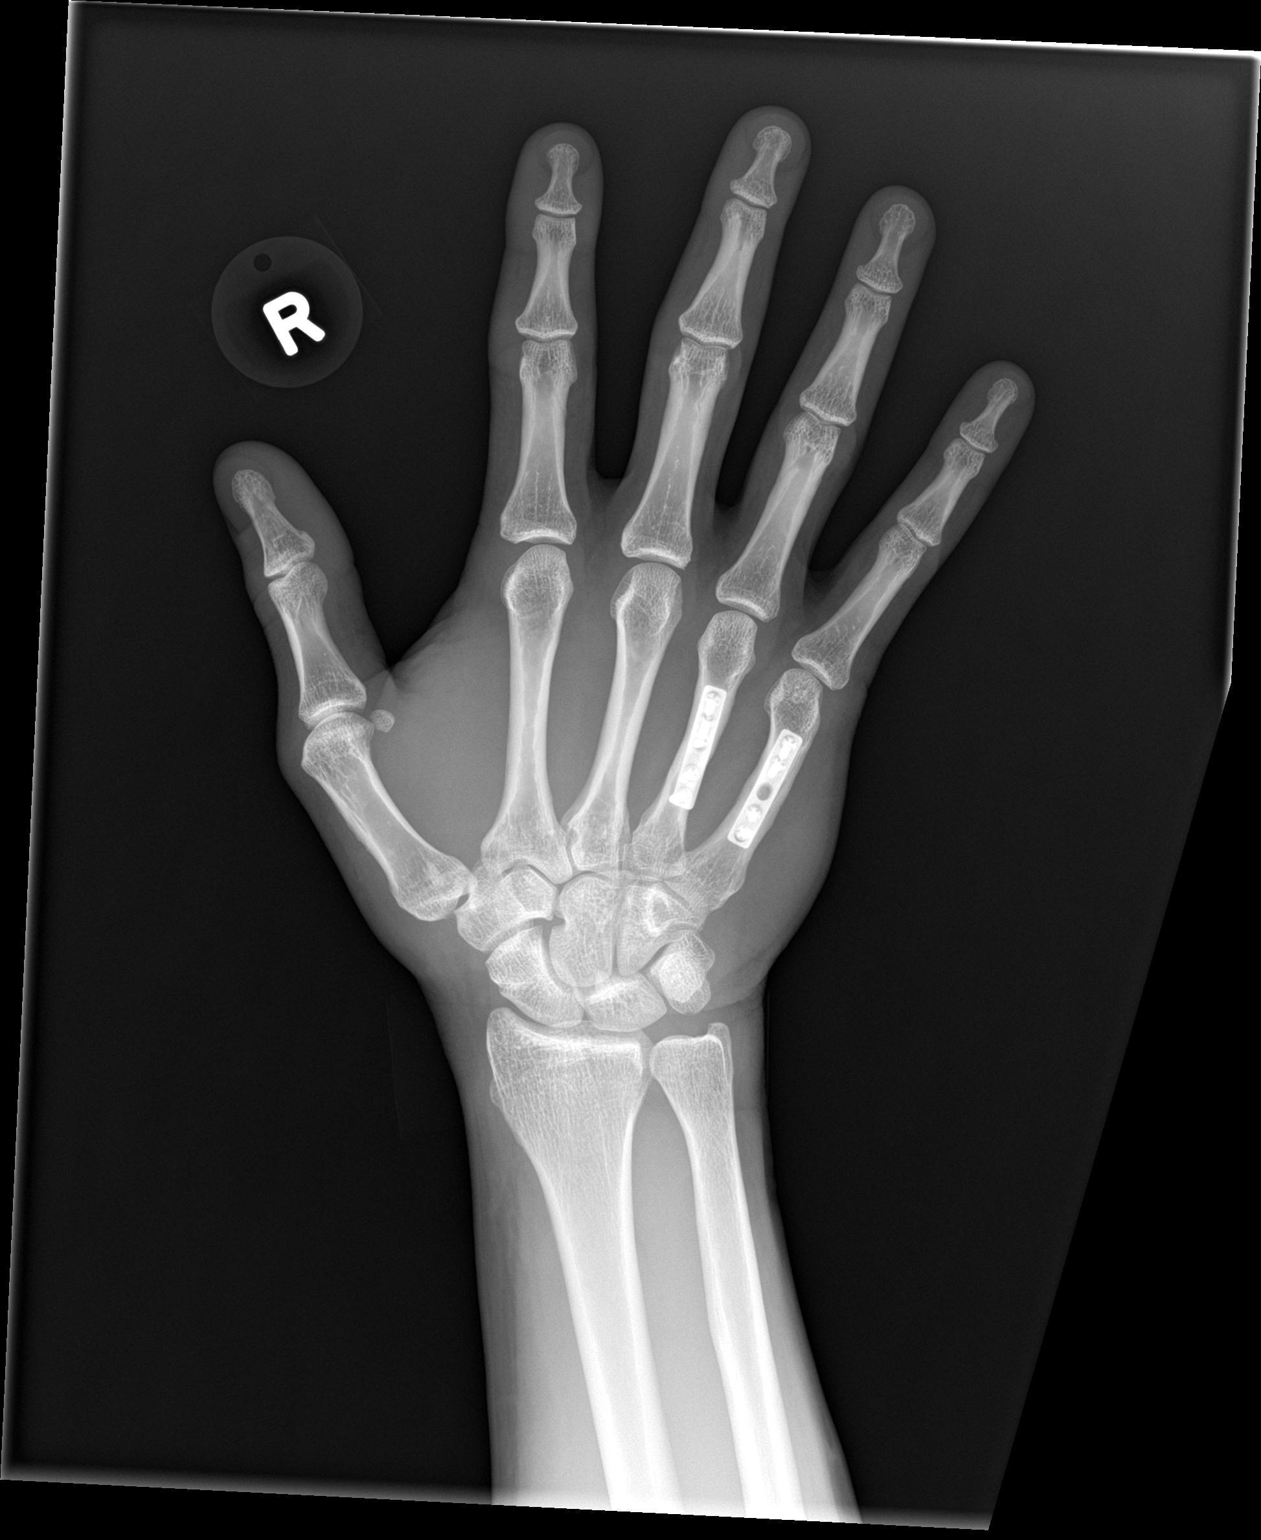
[im 2/3]
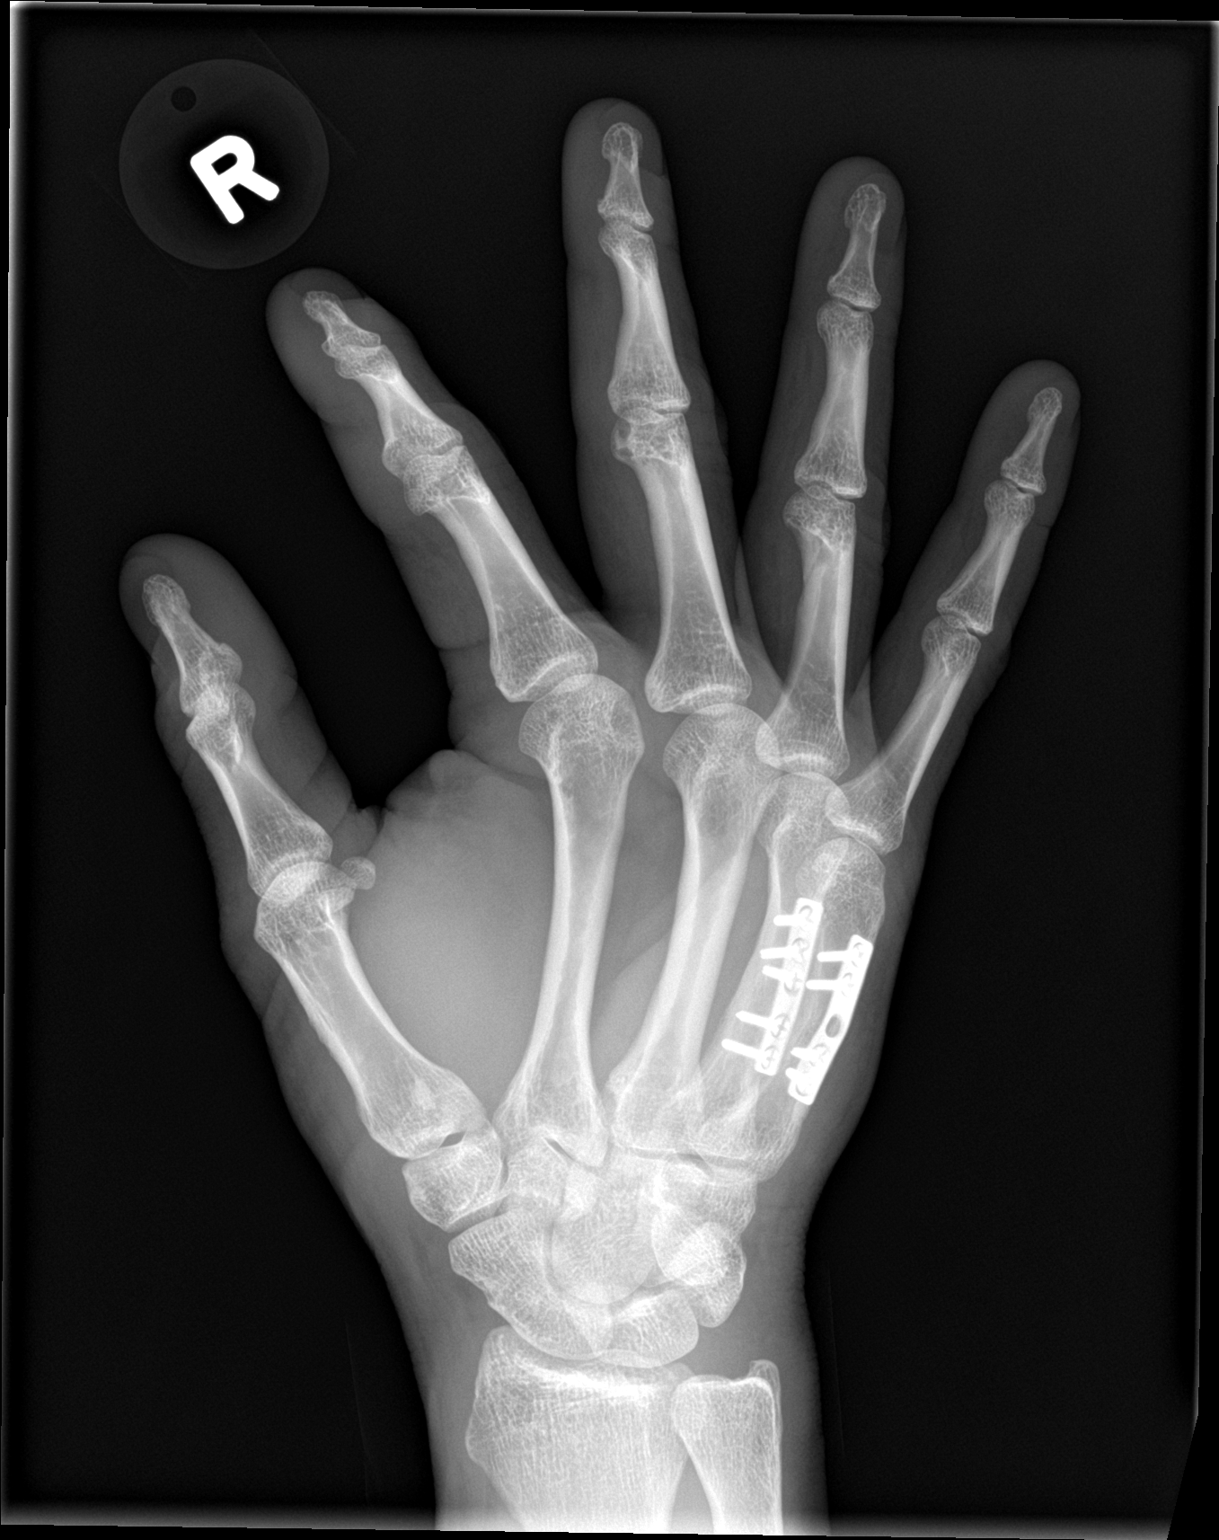
[im 3/3]
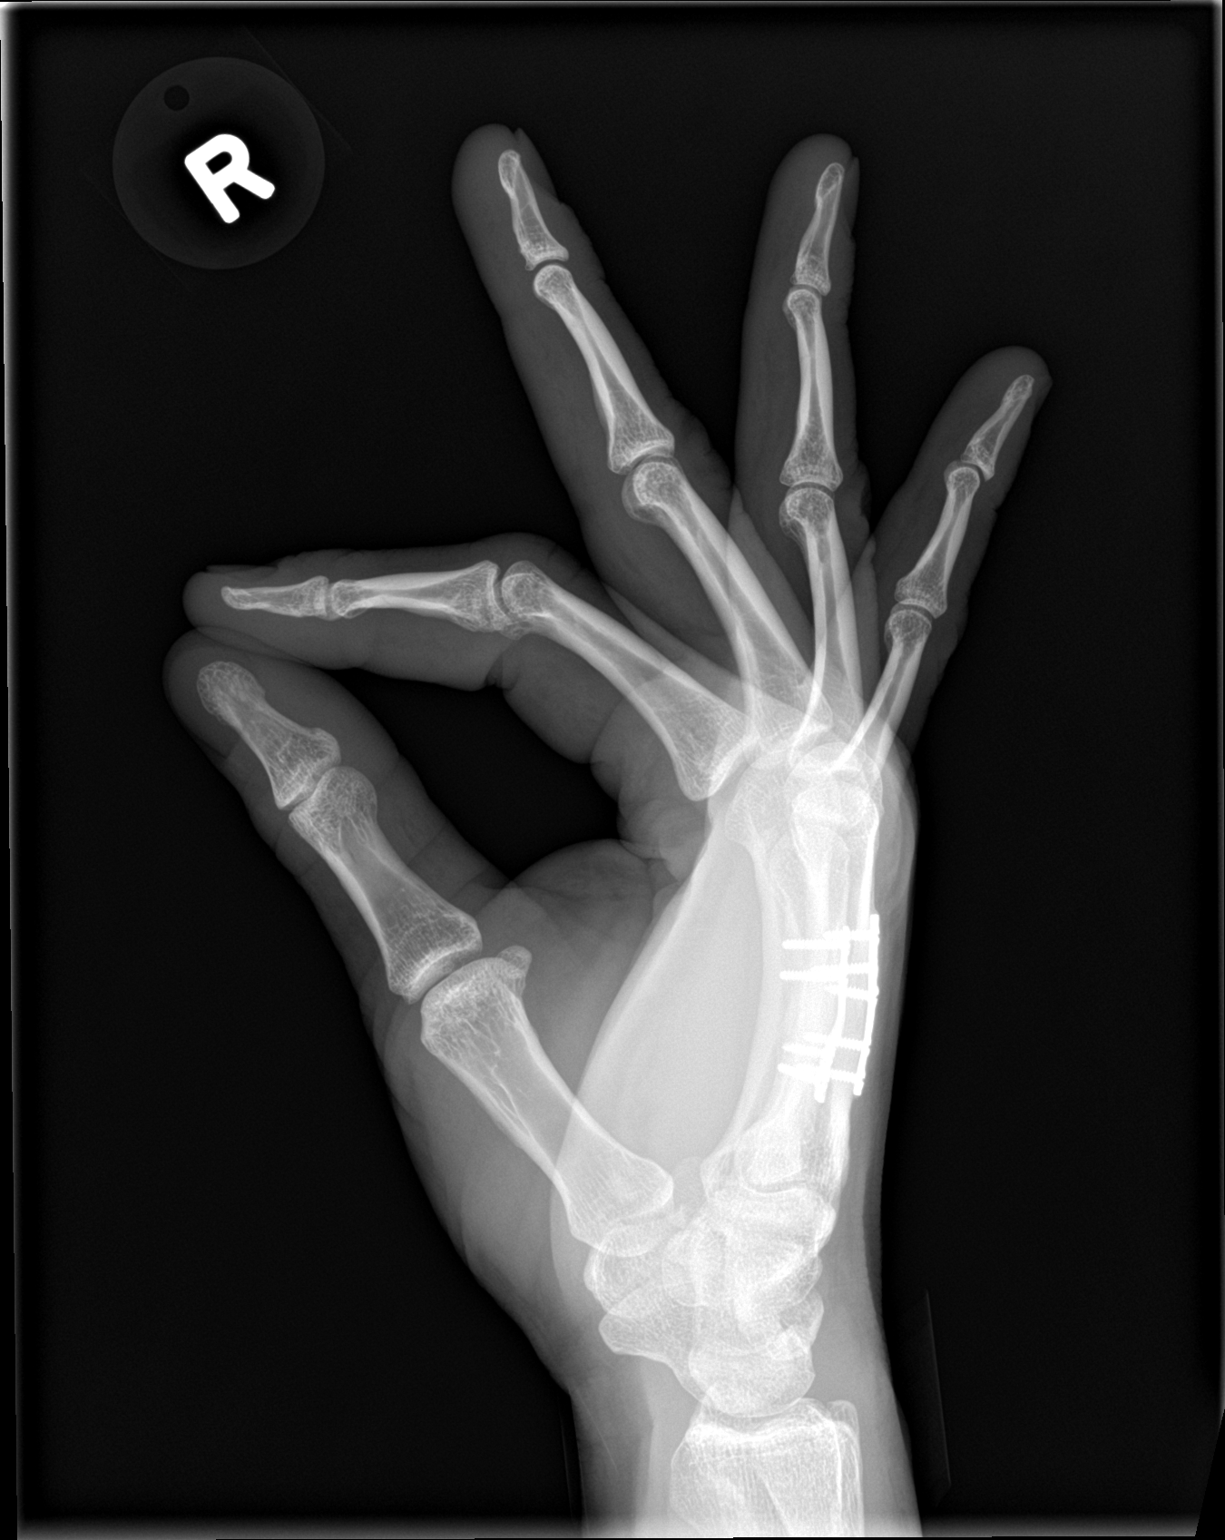

[3 of 3 positions shown; findings below may reference images not displayed]

FINDINGS: No fracture or dislocation. Healed fractures of the fourth and fifth
metacarpals transfix with metallic side plates and multiple
interlocking screws. Normal alignment. All soft tissues.
IMPRESSION: No acute osseous injury of the right hand.

## 2019-08-18 ENCOUNTER — Other Ambulatory Visit: Payer: Self-pay

## 2019-08-18 ENCOUNTER — Emergency Department (HOSPITAL_COMMUNITY)
Admission: EM | Admit: 2019-08-18 | Discharge: 2019-08-18 | Disposition: A | Discharge status: Home / Self Care | Payer: BC Managed Care – PPO | Attending: Emergency Medicine | Admitting: Emergency Medicine

## 2019-08-18 ENCOUNTER — Encounter (HOSPITAL_COMMUNITY): Payer: Self-pay | Admitting: Emergency Medicine

## 2019-08-18 DIAGNOSIS — J309 Allergic rhinitis, unspecified: Secondary | ICD-10-CM | POA: Diagnosis not present

## 2019-08-18 DIAGNOSIS — F419 Anxiety disorder, unspecified: Secondary | ICD-10-CM | POA: Insufficient documentation

## 2019-08-18 DIAGNOSIS — G8929 Other chronic pain: Secondary | ICD-10-CM | POA: Insufficient documentation

## 2019-08-18 DIAGNOSIS — K222 Esophageal obstruction: Secondary | ICD-10-CM

## 2019-08-18 DIAGNOSIS — Y999 Unspecified external cause status: Secondary | ICD-10-CM | POA: Diagnosis not present

## 2019-08-18 DIAGNOSIS — Y939 Activity, unspecified: Secondary | ICD-10-CM | POA: Diagnosis not present

## 2019-08-18 DIAGNOSIS — X58XXXA Exposure to other specified factors, initial encounter: Secondary | ICD-10-CM | POA: Insufficient documentation

## 2019-08-18 DIAGNOSIS — Z79899 Other long term (current) drug therapy: Secondary | ICD-10-CM | POA: Insufficient documentation

## 2019-08-18 DIAGNOSIS — M79641 Pain in right hand: Secondary | ICD-10-CM | POA: Diagnosis not present

## 2019-08-18 DIAGNOSIS — E876 Hypokalemia: Secondary | ICD-10-CM | POA: Diagnosis not present

## 2019-08-18 DIAGNOSIS — T18128A Food in esophagus causing other injury, initial encounter: Secondary | ICD-10-CM | POA: Diagnosis not present

## 2019-08-18 DIAGNOSIS — K219 Gastro-esophageal reflux disease without esophagitis: Secondary | ICD-10-CM | POA: Insufficient documentation

## 2019-08-18 DIAGNOSIS — Z79891 Long term (current) use of opiate analgesic: Secondary | ICD-10-CM | POA: Diagnosis not present

## 2019-08-18 DIAGNOSIS — Z9089 Acquired absence of other organs: Secondary | ICD-10-CM | POA: Insufficient documentation

## 2019-08-18 DIAGNOSIS — Y92009 Unspecified place in unspecified non-institutional (private) residence as the place of occurrence of the external cause: Secondary | ICD-10-CM | POA: Diagnosis not present

## 2019-08-18 DIAGNOSIS — F909 Attention-deficit hyperactivity disorder, unspecified type: Secondary | ICD-10-CM | POA: Diagnosis not present

## 2019-08-18 DIAGNOSIS — M545 Low back pain: Secondary | ICD-10-CM | POA: Insufficient documentation

## 2019-08-18 LAB — BASIC METABOLIC PANEL
Anion gap: 9 (ref 5–15)
BUN: 12 mg/dL (ref 6–20)
CO2: 26 mmol/L (ref 22–32)
Calcium: 9.1 mg/dL (ref 8.9–10.3)
Chloride: 103 mmol/L (ref 98–111)
Creatinine, Ser: 1.07 mg/dL (ref 0.61–1.24)
GFR calc Af Amer: >60 mL/min (ref >60)
GFR calc non Af Amer: >60 mL/min (ref >60)
Glucose, Bld: 97 mg/dL (ref 70–99)
Potassium: 3.3 mmol/L — ABNORMAL LOW (ref 3.5–5.1)
Sodium: 138 mmol/L (ref 135–145)

## 2019-08-18 LAB — CBC WITH DIFFERENTIAL/PLATELET
Abs Immature Granulocytes: 0.02 10*3/uL (ref 0.00–0.07)
Basophils Absolute: 0 10*3/uL (ref 0.0–0.1)
Basophils Relative: 0 %
Eosinophils Absolute: 0.1 10*3/uL (ref 0.0–0.5)
Eosinophils Relative: 2 %
HCT: 46.7 % (ref 39.0–52.0)
Hemoglobin: 15.6 g/dL (ref 13.0–17.0)
Immature Granulocytes: 0 %
Lymphocytes Relative: 27 %
Lymphs Abs: 1.9 10*3/uL (ref 0.7–4.0)
MCH: 30.3 pg (ref 26.0–34.0)
MCHC: 33.4 g/dL (ref 30.0–36.0)
MCV: 90.7 fL (ref 80.0–100.0)
Monocytes Absolute: 0.7 10*3/uL (ref 0.1–1.0)
Monocytes Relative: 10 %
Neutro Abs: 4.2 10*3/uL (ref 1.7–7.7)
Neutrophils Relative %: 61 %
Platelets: 309 10*3/uL (ref 150–400)
RBC: 5.15 MIL/uL (ref 4.22–5.81)
RDW: 13.2 % (ref 11.5–15.5)
WBC: 6.9 10*3/uL (ref 4.0–10.5)
nRBC: 0 % (ref 0.0–0.2)

## 2019-08-18 MED ORDER — SODIUM CHLORIDE 0.9 % IV BOLUS
1000.0000 mL | Freq: Once | INTRAVENOUS | Status: AC
  Administered 2019-08-18: 1000 mL via INTRAVENOUS

## 2019-08-18 MED ORDER — GLUCAGON HCL RDNA (DIAGNOSTIC) 1 MG IJ SOLR
1.0000 mg | Freq: Once | INTRAMUSCULAR | Status: AC
  Administered 2019-08-18: 1 mg via INTRAVENOUS
  Filled 2019-08-18: qty 1

## 2019-08-18 MED ORDER — LANSOPRAZOLE 30 MG PO CPDR: 30.0000 mg | DELAYED_RELEASE_CAPSULE | Freq: Two times a day (BID) | ORAL | 0 refills | Status: AC

## 2019-08-18 MED ORDER — LORAZEPAM 2 MG/ML IJ SOLN
1.0000 mg | Freq: Once | INTRAMUSCULAR | Status: AC
  Administered 2019-08-18: 1 mg via INTRAVENOUS
  Filled 2019-08-18: qty 1

## 2019-08-18 NOTE — ED Triage Notes (Signed)
Patient states that he has a piece of steak stuck in his throat after eating approx 1 hour ago. Per patient he has vomited x20 times and is unable to drink.

## 2019-08-18 NOTE — ED Notes (Signed)
Pt moved to rm 3 from hall   He has been given small amount of pepsi (tenderizer)   Followed by Ginger ale   Had drunk all without problems

## 2019-08-18 NOTE — ED Provider Notes (Signed)
[facility] EMERGENCY DEPARTMENT Provider Note   CSN: [id number] Arrival date & time: 08/18/19  1809     History Chief Complaint  Patient presents with  . Swallowed Foreign Body    Steven Murray is a 44 y.o. male.  Pt presents to the ED today with steak stuck in his esophagus.  Pt said he's had problems with food getting stuck in his esophagus for several years.  He said he's made appointments with GI, but cancels them.  He does have an appt in August with a GI doctor, but does not know the name.  Pt said he has always been able to get the food to go through until today.  Steak has been stuck for about 1 hour.  He's tried to make himself vomit, but he can't get the steak to come up.  He's been unable to drink.        Past Medical History:  Diagnosis Date  . Anxiety disorder   . Atypical chest pain   . B12 deficiency   . BACK PAIN 08/20/2007   Qualifier: Diagnosis of  By: Amador Cunas  MD, Janett Labella   . Carbuncle and furuncle of face 10/22/2007   Qualifier: Diagnosis of  By: Tawanna Cooler MD, Eugenio Hoes   . Chronic pain   . Chronic sinusitis   . EPIDIDYMITIS, LEFT 06/28/2007   Qualifier: Diagnosis of  By: Tawanna Cooler MD, Eugenio Hoes   . GERD (gastroesophageal reflux disease)   . Hyperlipidemia   . Hypogonadism in male   . Laryngopharyngeal reflux   . Orthopnea   . SEBACEOUS CYST, NECK 11/05/2007   Qualifier: Diagnosis of  By: Tawanna Cooler MD, Eugenio Hoes   . VIRAL URI 03/12/2007   Qualifier: Diagnosis of  By: Tawanna Cooler MD, Eugenio Hoes     Patient Active Problem List   Diagnosis Date Noted  . Chronic sacroiliac joint pain (Bilateral) (L>R) 12/28/2016  . Lumbar facet syndrome (Bilateral) (L>R) 12/28/2016  . Chronic lumbar radicular pain (S1) (Left) 12/28/2016  . Chronic hip pain (Bilateral) (L>R) 12/28/2016  . Chronic hand pain (Right) 12/28/2016  . Pharmacologic therapy 12/19/2016  . Problems influencing health status 12/19/2016  . Opiate use 12/19/2016  . Chronic low back pain (Primary Area of Pain)  (Bilateral) (L>R) 12/06/2016  . Chronic lower extremity pain (Secondary Area of Pain) (Bilateral) (L>R) 12/06/2016  . Long term current use of opiate analgesic 12/06/2016  . Chronic pain syndrome 12/06/2016  . Paresthesia of skin 12/06/2016  . Disorder of skeletal system 12/06/2016  . HEMOCHROMATOSIS, HX OF 07/02/2008  . Attention deficit disorder (ADD) in adult 01/21/2008  . ALLERGIC RHINITIS 07/02/2007    Past Surgical History:  Procedure Laterality Date  . TONSILLECTOMY         Family History  Problem Relation Age of Onset  . Multiple sclerosis Mother   . Cancer Father     Social History   Tobacco Use  . Smoking status: Never Smoker  . Smokeless tobacco: Never Used  Vaping Use  . Vaping Use: Never used  Substance Use Topics  . Alcohol use: No  . Drug use: No    Home Medications Prior to Admission medications   Medication Sig Start Date End Date Taking? Authorizing Provider  cetirizine (ZYRTEC) 10 MG tablet Take 10 mg by mouth daily as needed for allergies.     [provider]  gabapentin (NEURONTIN) 300 MG capsule Take 1 capsule (300 mg total) by mouth 3 (three) times daily. 11/15/16 11/17/16  Margarette Asal,  Delia Heady, PA-C  HYDROcodone-acetaminophen (NORCO) 10-325 MG tablet Take 1 tablet every 6 (six) hours as needed by mouth for moderate pain or severe pain.  08/07/15   [provider]  lansoprazole (PREVACID) 30 MG capsule Take 1 capsule (30 mg total) by mouth 2 (two) times daily before a meal. 08/18/19   Jacalyn Lefevre, MD  PROPECIA 1 MG tablet TAKE ONE TABLET BY MOUTH EVERY DAY Patient taking differently: TAKE ONE TABLET BY MOUTH EVERY THREE DAYS 06/22/10   Roderick Pee, MD  triamcinolone (NASACORT AQ) 55 MCG/ACT AERO nasal inhaler Place 2 sprays into the nose daily. Patient taking differently: Place 2 sprays into the nose daily as needed (allergies).  08/19/15   Devoria Albe, MD  pantoprazole (PROTONIX) 40 MG tablet Take 40 mg by mouth 2 (two) times  daily.  08/11/15 08/18/19  [provider]    Allergies    Penicillins  Review of Systems   Review of Systems  Gastrointestinal:       Steak stuck in esophagus  All other systems reviewed and are negative.   Physical Exam Updated Vital Signs BP (!) 146/98 (BP Location: Left Arm)   Pulse 93   Temp 98.7 F (37.1 C) (Oral)   Resp 18   Ht 5\' 8"  (1.727 m)   Wt 86.2 kg   SpO2 97%   BMI 28.89 kg/m   Physical Exam Vitals and nursing note reviewed.  Constitutional:      Appearance: Normal appearance.  HENT:     Head: Normocephalic and atraumatic.     Right Ear: External ear normal.     Left Ear: External ear normal.     Nose: Nose normal.     Mouth/Throat:     Mouth: Mucous membranes are moist.     Pharynx: Oropharynx is clear.  Eyes:     Extraocular Movements: Extraocular movements intact.     Conjunctiva/sclera: Conjunctivae normal.     Pupils: Pupils are equal, round, and reactive to light.  Cardiovascular:     Rate and Rhythm: Normal rate and regular rhythm.     Pulses: Normal pulses.     Heart sounds: Normal heart sounds.  Pulmonary:     Effort: Pulmonary effort is normal.     Breath sounds: Normal breath sounds.  Abdominal:     General: Abdomen is flat. Bowel sounds are normal.     Palpations: Abdomen is soft.  Musculoskeletal:        General: Normal range of motion.     Cervical back: Normal range of motion and neck supple.  Skin:    General: Skin is warm.     Capillary Refill: Capillary refill takes less than 2 seconds.  Neurological:     General: No focal deficit present.     Mental Status: He is alert and oriented to person, place, and time.  Psychiatric:        Mood and Affect: Mood is anxious.        Behavior: Behavior normal.        Thought Content: Thought content normal.        Judgment: Judgment normal.     ED Results / Procedures / Treatments   Labs (all labs ordered are listed, but only abnormal results are displayed) Labs  Reviewed  BASIC METABOLIC PANEL - Abnormal; Notable for the following components:      Result Value   Potassium 3.3 (*)    All other components within normal limits  CBC  WITH DIFFERENTIAL/PLATELET    EKG EKG Interpretation  Date/Time:  "Sunday August 18 2019 18:32:38 EDT Ventricular Rate:  95 PR Interval:  158 QRS Duration: 90 QT Interval:  354 QTC Calculation: 444 R Axis:   4 Text Interpretation: Normal sinus rhythm with sinus arrhythmia Minimal voltage criteria for LVH, may be normal variant ( R in aVL ) Inferior infarct , age undetermined Abnormal ECG No significant change since last tracing Confirmed by Alexandre Faries ([id number]) on 08/18/2019 6:41:02 PM   Radiology No results found.  Procedures Procedures (including critical care time)  Medications Ordered in ED Medications  sodium chloride 0.9 % bolus 1,000 mL (1,000 mLs Intravenous New Bag/Given 08/18/19 1909)  LORazepam (ATIVAN) injection 1 mg (1 mg Intravenous Given 08/18/19 1909)  glucagon (human recombinant) (GLUCAGEN) injection 1 mg (1 mg Intravenous Given 08/18/19 1910)    ED Course  I have reviewed the triage vital signs and the nursing notes.  Pertinent labs & imaging results that were available during my care of the patient were reviewed by me and considered in my medical decision making (see chart for details).    MDM Rules/Calculators/A&P                          Pt is able to swallow without problems after glucagon and ativan.  He has an appt with GI on 8/7 (he just does not remember which group).  He is encouraged to keep that appt as they will need to schedule and EGD for esophageal stretching.  He's been on protonix.  I am going to change him to prevacid.  He is encouraged to chew his food very well. Return if worse. Final Clinical Impression(s) / ED Diagnoses Final diagnoses:  Esophageal obstruction due to food impaction    Rx / DC Orders ED Discharge Orders         Ordered    lansoprazole (PREVACID)  30 MG capsule  2 times daily before meals     Discontinue  Reprint     07" /25/21 2008           2009, MD 08/18/19 2010

## 2019-10-11 ENCOUNTER — Ambulatory Visit: Payer: BC Managed Care – PPO | Admitting: Allergy & Immunology

## 2021-08-05 ENCOUNTER — Emergency Department (HOSPITAL_COMMUNITY)
Admission: EM | Admit: 2021-08-05 | Discharge: 2021-08-06 | Disposition: A | Discharge status: Home / Self Care | Payer: Self-pay | Attending: Emergency Medicine | Admitting: Emergency Medicine

## 2021-08-05 ENCOUNTER — Emergency Department (HOSPITAL_COMMUNITY): Payer: Self-pay

## 2021-08-05 ENCOUNTER — Encounter (HOSPITAL_COMMUNITY): Payer: Self-pay | Admitting: Emergency Medicine

## 2021-08-05 ENCOUNTER — Other Ambulatory Visit: Payer: Self-pay

## 2021-08-05 DIAGNOSIS — F419 Anxiety disorder, unspecified: Secondary | ICD-10-CM | POA: Insufficient documentation

## 2021-08-05 DIAGNOSIS — F410 Panic disorder [episodic paroxysmal anxiety] without agoraphobia: Secondary | ICD-10-CM

## 2021-08-05 DIAGNOSIS — Z79899 Other long term (current) drug therapy: Secondary | ICD-10-CM | POA: Insufficient documentation

## 2021-08-05 HISTORY — DX: Gastro-esophageal reflux disease without esophagitis: K21.9

## 2021-08-05 LAB — BASIC METABOLIC PANEL
Anion gap: 6 (ref 5–15)
BUN: 8 mg/dL (ref 6–20)
CO2: 29 mmol/L (ref 22–32)
Calcium: 9.4 mg/dL (ref 8.9–10.3)
Chloride: 103 mmol/L (ref 98–111)
Creatinine, Ser: 0.87 mg/dL (ref 0.61–1.24)
GFR, Estimated: >60 mL/min (ref >60)
Glucose, Bld: 111 mg/dL — ABNORMAL HIGH (ref 70–99)
Potassium: 4 mmol/L (ref 3.5–5.1)
Sodium: 138 mmol/L (ref 135–145)

## 2021-08-05 LAB — TROPONIN I (HIGH SENSITIVITY)
Troponin I (High Sensitivity): 2 ng/L (ref <18)
Troponin I (High Sensitivity): <2 ng/L (ref <18)

## 2021-08-05 LAB — CBC
HCT: 46.2 % (ref 39.0–52.0)
Hemoglobin: 15.9 g/dL (ref 13.0–17.0)
MCH: 29.9 pg (ref 26.0–34.0)
MCHC: 34.4 g/dL (ref 30.0–36.0)
MCV: 86.8 fL (ref 80.0–100.0)
Platelets: 336 10*3/uL (ref 150–400)
RBC: 5.32 MIL/uL (ref 4.22–5.81)
RDW: 12.2 % (ref 11.5–15.5)
WBC: 7.3 10*3/uL (ref 4.0–10.5)
nRBC: 0 % (ref 0.0–0.2)

## 2021-08-05 LAB — D-DIMER, QUANTITATIVE: D-Dimer, Quant: 0.38 ug/mL-FEU (ref 0.00–0.50)

## 2021-08-05 MED ORDER — LORAZEPAM 1 MG PO TABS
1.0000 mg | ORAL_TABLET | Freq: Once | ORAL | 0 refills | Status: AC | PRN
Start: 2021-08-05 — End: ?

## 2021-08-05 MED ORDER — LORAZEPAM 2 MG/ML IJ SOLN
1.0000 mg | Freq: Once | INTRAMUSCULAR | Status: AC
  Administered 2021-08-05: 1 mg via INTRAVENOUS
  Filled 2021-08-05: qty 1

## 2021-08-05 MED ORDER — SODIUM CHLORIDE 0.9 % IV BOLUS
1000.0000 mL | Freq: Once | INTRAVENOUS | Status: AC
  Administered 2021-08-05: 1000 mL via INTRAVENOUS

## 2021-08-05 NOTE — ED Notes (Addendum)
Pts wife came to talk to this RN, stated she feels like her husband is having a panic attack. 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 She said he will not calm down, is pacing the floor, stating he is so hot that he cannot breath.  Wife also states his HR has been high from his anxiety today so pt took 2 of his metoprolol pills from home. ED tech went into hook patient up to the monitors and explained that he needed to stay in the bed and leave all monitoring equipment on so we can monitor his vitals.   EDP made aware

## 2021-08-05 NOTE — Discharge Instructions (Signed)
If you develop recurrent, continued, or worsening chest pain, shortness of breath, fever, vomiting, abdominal or back pain, or any other new/concerning symptoms then return to the ER for evaluation.  

## 2021-08-05 NOTE — ED Notes (Signed)
ED Provider at bedside. 

## 2021-08-05 NOTE — ED Provider Notes (Signed)
[facility] EMERGENCY DEPARTMENT Provider Note   CSN: [id number] Arrival date & time: 08/05/21  1914     History  Chief Complaint  Patient presents with   Chest Pain    Steven Murray is a 46 y.o. male.  HPI 46 year old male presents with shortness of breath and chest tightness and concern for panic attack.  He has a longstanding history of panic issues and was recently prescribed Xanax.  At around 4 PM he developed shortness of breath that felt like a typical panic attack.  Took a fourth of a Xanax which did not help and then took a fourth of another 1.  Still did not seem to help and now he is developing chest tightness.  He also feels like a hatchet has been placed in the middle of his chest and going into both arms.  Some pleuritic symptoms.  He has never had a panic attack this bad.  He has chronic issues with high heart rate for which he takes metoprolol.  He denies any leg swelling, DVT history, recent surgery or travel.  Feels lightheaded.  Home Medications Prior to Admission medications   Medication Sig Start Date End Date Taking? Authorizing Provider  LORazepam (ATIVAN) 1 MG tablet Take 1 tablet (1 mg total) by mouth once as needed for up to 1 dose (anxiety attack). 08/05/21  Yes Pricilla Loveless, MD  cetirizine (ZYRTEC) 10 MG tablet Take 10 mg by mouth daily as needed for allergies.     [provider]  gabapentin (NEURONTIN) 300 MG capsule Take 1 capsule (300 mg total) by mouth 3 (three) times daily. 11/15/16 11/17/16  Liberty Handy, PA-C  HYDROcodone-acetaminophen (NORCO) 10-325 MG tablet Take 1 tablet every 6 (six) hours as needed by mouth for moderate pain or severe pain.  08/07/15   [provider]  lansoprazole (PREVACID) 30 MG capsule Take 1 capsule (30 mg total) by mouth 2 (two) times daily before a meal. 08/18/19   Jacalyn Lefevre, MD  PROPECIA 1 MG tablet TAKE ONE TABLET BY MOUTH EVERY DAY Patient taking differently: TAKE ONE TABLET BY MOUTH  EVERY THREE DAYS 06/22/10   Roderick Pee, MD  triamcinolone (NASACORT AQ) 55 MCG/ACT AERO nasal inhaler Place 2 sprays into the nose daily. Patient taking differently: Place 2 sprays into the nose daily as needed (allergies).  08/19/15   Devoria Albe, MD  pantoprazole (PROTONIX) 40 MG tablet Take 40 mg by mouth 2 (two) times daily.  08/11/15 08/18/19  [provider]      Allergies    Penicillins    Review of Systems   Review of Systems  Constitutional:  Negative for fever.  Respiratory:  Positive for chest tightness and shortness of breath. Negative for cough.   Cardiovascular:  Positive for chest pain. Negative for leg swelling.  Gastrointestinal:  Negative for abdominal pain.  Psychiatric/Behavioral:  The patient is nervous/anxious.     Physical Exam Updated Vital Signs BP 125/89   Pulse 77   Temp 98.3 F (36.8 C) (Oral)   Resp 15   Ht 5\' 8"  (1.727 m)   Wt 86.2 kg   SpO2 99%   BMI 28.89 kg/m  Physical Exam Vitals and nursing note reviewed.  Constitutional:      Appearance: He is well-developed.  HENT:     Head: Normocephalic and atraumatic.  Cardiovascular:     Rate and Rhythm: Normal rate and regular rhythm.     Heart sounds: Normal heart sounds.  Pulmonary:  Effort: Pulmonary effort is normal.     Breath sounds: Normal breath sounds.  Abdominal:     Palpations: Abdomen is soft.     Tenderness: There is no abdominal tenderness.  Musculoskeletal:     Right lower leg: No edema.     Left lower leg: No edema.  Skin:    General: Skin is warm and dry.  Neurological:     Mental Status: He is alert.  Psychiatric:        Mood and Affect: Mood is anxious.     ED Results / Procedures / Treatments   Labs (all labs ordered are listed, but only abnormal results are displayed) Labs Reviewed  BASIC METABOLIC PANEL - Abnormal; Notable for the following components:      Result Value   Glucose, Bld 111 (*)    All other components within normal limits  CBC   D-DIMER, QUANTITATIVE  TROPONIN I (HIGH SENSITIVITY)  TROPONIN I (HIGH SENSITIVITY)    EKG EKG Interpretation  Date/Time:  Thursday August 05 2021 19:21:24 EDT Ventricular Rate:  94 PR Interval:  172 QRS Duration: 92 QT Interval:  360 QTC Calculation: 450 R Axis:   4 Text Interpretation: Sinus rhythm with occasional Premature ventricular complexes Minimal voltage criteria for LVH, may be normal variant ( R in aVL ) Inferior infarct (cited on or before 18-Aug-2019) PVC new, otherwise EKG similar to July 2021 Confirmed by Pricilla Loveless [phone]) on 08/05/2021 8:16:08 PM  Radiology DG Chest 2 View  Result Date: 08/05/2021 CLINICAL DATA:  Chest pain EXAM: CHEST - 2 VIEW COMPARISON:  04/10/2016 FINDINGS: The heart size and mediastinal contours are within normal limits. Both lungs are clear. The visualized skeletal structures are unremarkable. IMPRESSION: No active cardiopulmonary disease. Electronically Signed   By: Jasmine Pang M.D.   On: 08/05/2021 19:50    Procedures Procedures    Medications Ordered in ED Medications  sodium chloride 0.9 % bolus 1,000 mL (0 mLs Intravenous Stopped 08/05/21 2311)  LORazepam (ATIVAN) injection 1 mg (1 mg Intravenous Given 08/05/21 2107)    ED Course/ Medical Decision Making/ A&P                           Medical Decision Making Amount and/or Complexity of Data Reviewed Independent Historian: spouse Labs: ordered.    Details: Troponin is normal x2.  Normal hemoglobin, D-dimer, white blood cell count Radiology: ordered and independent interpretation performed.    Details: No edema or pneumonia on chest x-ray ECG/medicine tests: ordered and independent interpretation performed.    Details: No acute ischemia, similar to prior  Risk Prescription drug management.   My suspicion is patient is having panic/anxiety attack given his history.  However with his chest pain and pleuritic symptoms a work-up was started which included a D-dimer for  otherwise low risk PE which is negative.  Negative troponins, EKG, chest x-ray.  Low suspicion this is ACS.  He was given a dose of Ativan and fluids.  He is feeling a lot better.  He is requesting a prescription for a single dose of something in case he has a recurrent panic attack.  He states he only has 1/2 tablet of Xanax left.  He is unable to talk to his doctor tomorrow as their office is closed and so I do not think this is unreasonable but stressed the need to follow-up with his PCP for further outpatient medical management.  Will discharge home with return  precautions.        Final Clinical Impression(s) / ED Diagnoses Final diagnoses:  Anxiety attack    Rx / DC Orders ED Discharge Orders          Ordered    LORazepam (ATIVAN) 1 MG tablet  Once PRN        08/05/21 9292              Pricilla Loveless, MD 08/05/21 2352

## 2021-08-05 NOTE — ED Triage Notes (Signed)
Pt c/o chest tightness and sob x 2 hours.

## 2021-08-17 ENCOUNTER — Ambulatory Visit (HOSPITAL_COMMUNITY): Payer: Self-pay | Admitting: Student in an Organized Health Care Education/Training Program

## 2023-11-13 ENCOUNTER — Encounter (HOSPITAL_COMMUNITY): Payer: Self-pay

## 2023-11-13 ENCOUNTER — Emergency Department (HOSPITAL_COMMUNITY)
Admission: EM | Admit: 2023-11-13 | Discharge: 2023-11-13 | Disposition: A | Discharge status: Home / Self Care | Attending: Emergency Medicine | Admitting: Emergency Medicine

## 2023-11-13 ENCOUNTER — Other Ambulatory Visit: Payer: Self-pay

## 2023-11-13 DIAGNOSIS — H0012 Chalazion right lower eyelid: Secondary | ICD-10-CM | POA: Insufficient documentation

## 2023-11-13 DIAGNOSIS — L03213 Periorbital cellulitis: Secondary | ICD-10-CM | POA: Insufficient documentation

## 2023-11-13 DIAGNOSIS — H0289 Other specified disorders of eyelid: Secondary | ICD-10-CM | POA: Diagnosis present

## 2023-11-13 MED ORDER — ERYTHROMYCIN 5 MG/GM OP OINT: 1.0000 | TOPICAL_OINTMENT | Freq: Four times a day (QID) | OPHTHALMIC | 1 refills | Status: DC

## 2023-11-13 MED ORDER — TETRACAINE HCL 0.5 % OP SOLN
1.0000 [drp] | Freq: Once | OPHTHALMIC | Status: AC
  Administered 2023-11-13: 1 [drp] via OPHTHALMIC

## 2023-11-13 MED ORDER — ERYTHROMYCIN 5 MG/GM OP OINT: 1.0000 | TOPICAL_OINTMENT | Freq: Four times a day (QID) | OPHTHALMIC | 1 refills | Status: AC

## 2023-11-13 MED ORDER — TETRACAINE HCL 0.5 % OP SOLN
1.0000 [drp] | Freq: Once | OPHTHALMIC | Status: DC
  Filled 2023-11-13: qty 4

## 2023-11-13 MED ORDER — CLINDAMYCIN HCL 300 MG PO CAPS: 300.0000 mg | ORAL_CAPSULE | Freq: Four times a day (QID) | ORAL | 0 refills | Status: AC

## 2023-11-13 MED ORDER — ERYTHROMYCIN 5 MG/GM OP OINT
TOPICAL_OINTMENT | Freq: Once | OPHTHALMIC | Status: AC
  Administered 2023-11-13: 1 via OPHTHALMIC
  Filled 2023-11-13: qty 3.5

## 2023-11-13 MED ORDER — FLUORESCEIN SODIUM 1 MG OP STRP
1.0000 | ORAL_STRIP | Freq: Once | OPHTHALMIC | Status: AC
  Administered 2023-11-13: 1 via OPHTHALMIC
  Filled 2023-11-13: qty 1

## 2023-11-13 NOTE — Discharge Instructions (Addendum)
 Your examination distant with something called a chalazion, please apply the erythromycin  ointment every 6 hours, warm compresses or hot showers twice a day, clindamycin as prescribed, see your eye doctor within 1 week if no better but ER for severe worsening pain swelling redness fevers or changes in vision  I would recommend seeing an eye doctor or optometrist within the next week for follow-up

## 2023-11-13 NOTE — ED Triage Notes (Signed)
 Pt arrived via POV c/o right eye injury. 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 Pt presents with his right eye swollen shut and reports when he opens it, there is drainage and pus coming from his eye. Pt reports taking antibiotics he had at home from previous infections and reports using eye drops w/o relief.

## 2023-11-13 NOTE — ED Provider Notes (Signed)
 [facility] EMERGENCY DEPARTMENT AT [facility] Provider Note   CSN: [id number] Arrival date & time: 11/13/23  1800     Patient presents with: Eye Injury   Steven Murray is a 48 y.o. male.    Eye Injury     This patient is a 48 year old male presenting with right lower eyelid swelling, this started within the last few days and has progressively gotten worse.  It is the right eye only, it only involves the lower lid, it is not affected his vision at all.  He noticed a small lump on the inner surface of the lower lid so he tried to squeeze it and states that there was some blood and pus that came out of it.  Now his entire right lower eyelid is red and swollen.  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 Authorizing Provider  clindamycin (CLEOCIN) 300 MG capsule Take 1 capsule (300 mg total) by mouth 4 (four) times daily for 7 days. May dispense as 150mg  capsules 11/13/23 11/20/23 Yes Cleotilde Rogue, MD  cetirizine (ZYRTEC) 10 MG tablet Take 10 mg by mouth daily as needed for allergies.     [provider]  erythromycin  ophthalmic ointment Place 1 Application into the right eye every 6 (six) hours. Place 1/2 inch ribbon of ointment in the affected eye 4 times a day 11/13/23   Cleotilde Rogue, MD  gabapentin  (NEURONTIN ) 300 MG capsule Take 1 capsule (300 mg total) by mouth 3 (three) times daily. 11/15/16 11/17/16  Gibbons, Claudia J, PA-C  HYDROcodone-acetaminophen  (NORCO) 10-325 MG tablet Take 1 tablet every 6 (six) hours as needed by mouth for moderate pain or severe pain.  08/07/15   [provider]  lansoprazole  (PREVACID ) 30 MG capsule Take 1 capsule (30 mg total) by mouth 2 (two) times daily before a meal. 08/18/19   Dean Clarity, MD  LORazepam  (ATIVAN ) 1 MG tablet Take 1 tablet (1 mg  total) by mouth once as needed for up to 1 dose (anxiety attack). 08/05/21   Freddi Hamilton, MD  PROPECIA 1 MG tablet TAKE ONE TABLET BY MOUTH EVERY DAY Patient taking differently: TAKE ONE TABLET BY MOUTH EVERY THREE DAYS 06/22/10   Krystal Reyes LABOR, MD  triamcinolone  (NASACORT  AQ) 55 MCG/ACT AERO nasal inhaler Place 2 sprays into the nose daily. Patient taking differently: Place 2 sprays into the nose daily as needed (allergies).  08/19/15   Knapp, Iva, MD  pantoprazole (PROTONIX) 40 MG tablet Take 40 mg by mouth 2 (two) times daily.  08/11/15 08/18/19  [provider]    Allergies: Penicillins    Review of Systems  All other systems reviewed and are negative.   Updated Vital Signs BP (!) 135/92   Pulse 96   Temp 98.4 F (36.9 C) (Oral)   Resp 18   Ht 1.727 m (5' 8)   Wt 85.3 kg   SpO2 97%   BMI 28.59 kg/m   Physical Exam Vitals and nursing note reviewed.  Constitutional:      General: He is not in acute distress.    Appearance: He is well-developed.  HENT:     Head: Normocephalic and atraumatic.     Mouth/Throat:     Pharynx: No oropharyngeal exudate.  Eyes:     General: No scleral icterus.  Right eye: No discharge.        Left eye: No discharge.     Conjunctiva/sclera: Conjunctivae normal.     Pupils: Pupils are equal, round, and reactive to light.     Comments: Tetracaine  and fluorescein exam with Woods lamp, no signs of foreign body material, no signs of corneal abrasion, he does have a chalazion on the inner surface of the lower lid.  The lower lid is mildly edematous and erythematous, there is no induration, no spread onto the cheek or the face or the upper lid, pupils are normal, conjunctive are clear  Neck:     Thyroid : No thyromegaly.     Vascular: No JVD.  Cardiovascular:     Rate and Rhythm: Normal rate and regular rhythm.     Heart sounds: Normal heart sounds. No murmur heard.    No friction rub. No gallop.  Pulmonary:     Effort: Pulmonary  effort is normal. No respiratory distress.     Breath sounds: Normal breath sounds. No wheezing or rales.  Abdominal:     General: Bowel sounds are normal. There is no distension.     Palpations: Abdomen is soft. There is no mass.     Tenderness: There is no abdominal tenderness.  Musculoskeletal:        General: No tenderness. Normal range of motion.     Cervical back: Normal range of motion and neck supple.  Lymphadenopathy:     Cervical: No cervical adenopathy.  Skin:    General: Skin is warm and dry.     Findings: No erythema or rash.  Neurological:     Mental Status: He is alert.     Coordination: Coordination normal.  Psychiatric:        Behavior: Behavior normal.     (all labs ordered are listed, but only abnormal results are displayed) Labs Reviewed - No data to display  EKG: None  Radiology: No results found.   Procedures   Medications Ordered in the ED  erythromycin  ophthalmic ointment (has no administration in time range)  fluorescein ophthalmic strip 1 strip (1 strip Both Eyes Given 11/13/23 2057)  tetracaine  (PONTOCAINE) 0.5 % ophthalmic solution 1 drop (1 drop Right Eye Given 11/13/23 2058)                                    Medical Decision Making Risk Prescription drug management.   Chalazion, no foreign body, home with medications to treat for both topical erythromycin , warm compresses and will give prescription for oral antibiotics secondary to the significant swelling of the right lower lid which could be an early preseptal cellulitis after he tried to squeeze on it.  Follow-up with optometry or ophthalmology, patient agreeable, vitals unremarkable, vision preserved     Final diagnoses:  Chalazion of right lower eyelid  Preseptal cellulitis of right lower eyelid    ED Discharge Orders          Ordered    erythromycin  ophthalmic ointment  Every 6 hours,   Status:  Discontinued        11/13/23 2059    clindamycin (CLEOCIN) 300 MG capsule   4 times daily        11/13/23 2059    erythromycin  ophthalmic ointment  Every 6 hours        11/13/23 2100  Cleotilde Rogue, MD 11/13/23 2102

## 2024-01-03 ENCOUNTER — Encounter (HOSPITAL_COMMUNITY): Payer: Self-pay

## 2024-01-03 ENCOUNTER — Other Ambulatory Visit: Payer: Self-pay

## 2024-01-03 ENCOUNTER — Emergency Department (HOSPITAL_COMMUNITY)
Admission: EM | Admit: 2024-01-03 | Discharge: 2024-01-03 | Disposition: A | Discharge status: Home / Self Care | Attending: Emergency Medicine | Admitting: Emergency Medicine

## 2024-01-03 DIAGNOSIS — Z760 Encounter for issue of repeat prescription: Secondary | ICD-10-CM | POA: Insufficient documentation

## 2024-01-03 DIAGNOSIS — H6690 Otitis media, unspecified, unspecified ear: Secondary | ICD-10-CM

## 2024-01-03 DIAGNOSIS — H6692 Otitis media, unspecified, left ear: Secondary | ICD-10-CM | POA: Insufficient documentation

## 2024-01-03 DIAGNOSIS — R0981 Nasal congestion: Secondary | ICD-10-CM | POA: Diagnosis present

## 2024-01-03 LAB — RESP PANEL BY RT-PCR (RSV, FLU A&B, COVID)  RVPGX2
Influenza A by PCR: NEGATIVE
Influenza B by PCR: NEGATIVE
Resp Syncytial Virus by PCR: NEGATIVE
SARS Coronavirus 2 by RT PCR: NEGATIVE

## 2024-01-03 MED ORDER — METOPROLOL TARTRATE 25 MG PO TABS
25.0000 mg | ORAL_TABLET | Freq: Once | ORAL | Status: AC
  Administered 2024-01-03: 25 mg via ORAL
  Filled 2024-01-03: qty 1

## 2024-01-03 MED ORDER — DEXAMETHASONE SOD PHOSPHATE PF 10 MG/ML IJ SOLN
10.0000 mg | Freq: Once | INTRAMUSCULAR | Status: AC
  Administered 2024-01-03: 10 mg via INTRAMUSCULAR

## 2024-01-03 MED ORDER — AZITHROMYCIN 250 MG PO TABS: 250.0000 mg | ORAL_TABLET | Freq: Every day | ORAL | 0 refills | Status: AC

## 2024-01-03 MED ORDER — AZITHROMYCIN 250 MG PO TABS
500.0000 mg | ORAL_TABLET | Freq: Once | ORAL | Status: AC
  Administered 2024-01-03: 500 mg via ORAL
  Filled 2024-01-03: qty 2

## 2024-01-03 MED ORDER — METOPROLOL TARTRATE 25 MG PO TABS: 25.0000 mg | ORAL_TABLET | Freq: Two times a day (BID) | ORAL | 0 refills | Status: AC

## 2024-01-03 NOTE — ED Triage Notes (Signed)
 Pt arrived via POV c/o left ear pain for the past few days. Pt reports pain began after using a Netti Pot for his sinus congestion. Pt concerned he may still have a URI.

## 2024-01-03 NOTE — ED Notes (Signed)
 Pt also reports he needs a refill of his Metoprolol  and is requesting a dose of this medication while he is here today due to running out of his medications for past couple of months.

## 2024-01-03 NOTE — Discharge Instructions (Signed)
 You have been prescribed antibiotics and I have refilled your metoprolol  for you.  Is important that you follow-up with your primary care provider for recheck.  Take the antibiotic as directed until finished.  Start the antibiotic prescription tomorrow.  You may take Tylenol  every 4 hours if needed for fever and/or pain.

## 2024-01-05 NOTE — ED Provider Notes (Signed)
 [facility] AT [facility] Provider Note   CSN: [id number] Arrival date & time: 01/03/24  1250     Patient presents with: Steven Murray is a 48 y.o. male.    Otalgia Associated symptoms: congestion        Steven Murray is a 48 y.o. male with past medical history of anxiety, GERD who presents to the Emergency Department complaining of left ear pain for several days.  States pain is gradually worsening.  At onset, he felt as though he had a sinus infection and began using a Nettie pot.  Felt that his nasal congestion cleared but began feeling pressure to his left ear.  He has been holding his nose and blowing to try to get my ear to pop without relief.  Denies any fever, neck pain dizziness or decreased hearing.  No visual changes or headache.  He is also requesting a refill for his metoprolol  as he ran out several days ago and states he takes it for tachycardia.  Prior to Admission medications  Medication Sig Start Date End Date Taking? Authorizing Provider  azithromycin  (ZITHROMAX ) 250 MG tablet Take 1 tablet (250 mg total) by mouth daily. Take first 2 tablets together, then 1 every day until finished. 01/03/24  Yes Boluwatife Flight, PA-C  metoprolol  tartrate (LOPRESSOR ) 25 MG tablet Take 25 mg by mouth 2 (two) times daily. 09/09/17  Yes [provider]  metoprolol  tartrate (LOPRESSOR ) 25 MG tablet Take 1 tablet (25 mg total) by mouth 2 (two) times daily. 01/03/24  Yes Ritha Sampedro, PA-C  cetirizine (ZYRTEC) 10 MG tablet Take 10 mg by mouth daily as needed for allergies.     [provider]  erythromycin  ophthalmic ointment Place 1 Application into the right eye every 6 (six) hours. Place 1/2 inch ribbon of ointment in the affected eye 4 times a day 11/13/23   Cleotilde Rogue, MD  gabapentin  (NEURONTIN ) 300 MG capsule Take 1 capsule (300 mg total) by mouth 3 (three) times daily. 11/15/16 11/17/16  Gibbons, Claudia J, PA-C   HYDROcodone-acetaminophen  (NORCO) 10-325 MG tablet Take 1 tablet every 6 (six) hours as needed by mouth for moderate pain or severe pain.  08/07/15   [provider]  lansoprazole  (PREVACID ) 30 MG capsule Take 1 capsule (30 mg total) by mouth 2 (two) times daily before a meal. 08/18/19   Dean Clarity, MD  LORazepam  (ATIVAN ) 1 MG tablet Take 1 tablet (1 mg total) by mouth once as needed for up to 1 dose (anxiety attack). 08/05/21   Freddi Hamilton, MD  PROPECIA 1 MG tablet TAKE ONE TABLET BY MOUTH EVERY DAY Patient taking differently: TAKE ONE TABLET BY MOUTH EVERY THREE DAYS 06/22/10   Krystal Reyes LABOR, MD  triamcinolone  (NASACORT  AQ) 55 MCG/ACT AERO nasal inhaler Place 2 sprays into the nose daily. Patient taking differently: Place 2 sprays into the nose daily as needed (allergies).  08/19/15   Knapp, Iva, MD  pantoprazole (PROTONIX) 40 MG tablet Take 40 mg by mouth 2 (two) times daily.  08/11/15 08/18/19  [provider]    Allergies: Penicillins    Review of Systems  HENT:  Positive for congestion, ear pain and sinus pressure.   All other systems reviewed and are negative.   Updated Vital Signs BP (!) 137/107   Pulse 83   Temp 97.8 F (36.6 C) (Oral)   Resp 15   Ht 5' 8 (1.727 m)   Wt 85.3 kg  SpO2 97%   BMI 28.59 kg/m   Physical Exam Vitals and nursing note reviewed.  Constitutional:      General: He is not in acute distress.    Appearance: Normal appearance. He is not toxic-appearing.  HENT:     Right Ear: Tympanic membrane and ear canal normal.     Left Ear: Ear canal normal.     Ears:     Comments:   Significant erythema of the left TM.  No bulging but TM or perforation.  No drainage into the ear canal    Mouth/Throat:     Mouth: Mucous membranes are moist.     Pharynx: Oropharynx is clear. No oropharyngeal exudate or posterior oropharyngeal erythema.  Cardiovascular:     Rate and Rhythm: Normal rate and regular rhythm.     Pulses: Normal pulses.   Pulmonary:     Effort: Pulmonary effort is normal.  Abdominal:     Palpations: Abdomen is soft.     Tenderness: There is no abdominal tenderness.  Musculoskeletal:        General: Normal range of motion.  Skin:    General: Skin is warm.     Capillary Refill: Capillary refill takes less than 2 seconds.  Neurological:     General: No focal deficit present.     Mental Status: He is alert.     Sensory: No sensory deficit.     Motor: No weakness.     (all labs ordered are listed, but only abnormal results are displayed) Labs Reviewed  RESP PANEL BY RT-PCR (RSV, FLU A&B, COVID)  [id number]    EKG: None  Radiology: No results found.   Procedures   Medications Ordered in the ED  dexamethasone  (DECADRON ) injection 10 mg (10 mg Intramuscular Given 01/03/24 1630)  azithromycin  (ZITHROMAX ) tablet 500 mg (500 mg Oral Given 01/03/24 1628)  metoprolol  tartrate (LOPRESSOR ) tablet 25 mg (25 mg Oral Given 01/03/24 1628)                                    Medical Decision Making    Patient nontoxic-appearing, here with sinus symptoms and left ear pain for several days.  No fever headache dizziness or decreased hearing.  On my exam he is well-appearing nontoxic.  Hypertensive but has been out of his metoprolol .  He does have a left otitis media.  I do not appreciate any bulging or perforation of the TM.  Amount and/or Complexity of Data Reviewed Labs: ordered.    Details: Respiratory panel negative Discussion of management or test interpretation with external provider(s):  Will treat with antibiotic, I will also refill his metoprolol  with the understanding that he will follow-up closely outpatient with his PCP to have his blood pressure rechecked.  Agreeable to plan, appropriate for discharge home  Risk Prescription drug management.        Final diagnoses:  Acute otitis media, unspecified otitis media type  Medication refill    ED Discharge Orders          Ordered     metoprolol  tartrate (LOPRESSOR ) 25 MG tablet  2 times daily        01/03/24 1659    azithromycin  (ZITHROMAX ) 250 MG tablet  Daily        01/03/24 1659               Herlinda Milling, PA-C 01/05/24 1447    Cleotilde Rogue,  MD 01/07/24 1510
